# Patient Record
Sex: Female | Born: 1945 | Race: Black or African American | Hispanic: No | Marital: Married | Smoking: Former smoker
Health system: Southern US, Community
[De-identification: ages and names within clinical notes are randomized; demographics above are authoritative.]

## PROBLEM LIST (undated history)

## (undated) DIAGNOSIS — G20A1 Parkinson's disease without dyskinesia, without mention of fluctuations: Secondary | ICD-10-CM

## (undated) DIAGNOSIS — H409 Unspecified glaucoma: Secondary | ICD-10-CM

## (undated) DIAGNOSIS — G20 Parkinson's disease: Secondary | ICD-10-CM

## (undated) DIAGNOSIS — R32 Unspecified urinary incontinence: Secondary | ICD-10-CM

## (undated) DIAGNOSIS — E079 Disorder of thyroid, unspecified: Secondary | ICD-10-CM

## (undated) DIAGNOSIS — I456 Pre-excitation syndrome: Secondary | ICD-10-CM

## (undated) DIAGNOSIS — K219 Gastro-esophageal reflux disease without esophagitis: Secondary | ICD-10-CM

## (undated) DIAGNOSIS — E119 Type 2 diabetes mellitus without complications: Secondary | ICD-10-CM

## (undated) DIAGNOSIS — M1990 Unspecified osteoarthritis, unspecified site: Secondary | ICD-10-CM

## (undated) DIAGNOSIS — K635 Polyp of colon: Secondary | ICD-10-CM

## (undated) DIAGNOSIS — I10 Essential (primary) hypertension: Secondary | ICD-10-CM

## (undated) DIAGNOSIS — K900 Celiac disease: Secondary | ICD-10-CM

## (undated) DIAGNOSIS — G5600 Carpal tunnel syndrome, unspecified upper limb: Secondary | ICD-10-CM

## (undated) HISTORY — PX: VASCULAR SURGERY: SHX849

## (undated) HISTORY — PX: ABDOMINAL HYSTERECTOMY: SHX81

## (undated) HISTORY — PX: BREAST SURGERY: SHX581

## (undated) HISTORY — DX: Celiac disease: K90.0

---

## 2015-12-24 ENCOUNTER — Emergency Department (HOSPITAL_COMMUNITY)
Admission: EM | Admit: 2015-12-24 | Discharge: 2015-12-25 | Disposition: A | Discharge status: Home / Self Care | Payer: Medicare Other | Attending: Emergency Medicine | Admitting: Emergency Medicine

## 2015-12-24 ENCOUNTER — Encounter (HOSPITAL_COMMUNITY): Payer: Self-pay | Admitting: Oncology

## 2015-12-24 DIAGNOSIS — R600 Localized edema: Secondary | ICD-10-CM

## 2015-12-24 DIAGNOSIS — R609 Edema, unspecified: Secondary | ICD-10-CM | POA: Insufficient documentation

## 2015-12-24 DIAGNOSIS — Z7982 Long term (current) use of aspirin: Secondary | ICD-10-CM | POA: Insufficient documentation

## 2015-12-24 DIAGNOSIS — I10 Essential (primary) hypertension: Secondary | ICD-10-CM | POA: Insufficient documentation

## 2015-12-24 DIAGNOSIS — Z87891 Personal history of nicotine dependence: Secondary | ICD-10-CM | POA: Insufficient documentation

## 2015-12-24 HISTORY — DX: Essential (primary) hypertension: I10

## 2015-12-24 NOTE — ED Provider Notes (Signed)
CSN: [id number]     Arrival date & time 12/24/15  2126 History   First MD Initiated Contact with Patient 12/24/15 2236     Chief Complaint  Patient presents with  . Leg Swelling     (Consider location/radiation/quality/duration/timing/severity/associated sxs/prior Treatment) HPI Comments: Patient presents with leg swelling. She states over the last 1-2 years she's had swelling of both of her legs. The left leg is always a little bit more swollen than the right leg. She states over the last 2-3 months the swelling is been worse. It typically is better if she can keep her feet elevated but when she starts walking around the get more swollen. She denies any shortness of breath. She denies any chest pain. She denies any fevers. She's had some increased redness in her lower extremities with increased swelling. This redness has been there about a month. She previously was being seen in [place] where she is from. She is on medications or hypertension including Aldactone. She states she doesn't always take the Aldactone regularly because it makes her urinate more frequently.   Past Medical History  Diagnosis Date  . Hypertension    Past Surgical History  Procedure Laterality Date  . Breast surgery      lumpectomy  . Abdominal hysterectomy    . Vascular surgery     History reviewed. No pertinent family history. Social History  Substance Use Topics  . Smoking status: Former Games developer  . Smokeless tobacco: Never Used  . Alcohol Use: No   OB History    No data available     Review of Systems  Constitutional: Negative for fever, chills, diaphoresis and fatigue.  HENT: Negative for congestion, rhinorrhea and sneezing.   Eyes: Negative.   Respiratory: Negative for cough, chest tightness and shortness of breath.   Cardiovascular: Positive for leg swelling. Negative for chest pain.  Gastrointestinal: Negative for nausea, vomiting, abdominal pain, diarrhea and blood in stool.  Genitourinary:  Negative for frequency, hematuria, flank pain and difficulty urinating.  Musculoskeletal: Negative for back pain and arthralgias.  Skin: Negative for rash.  Neurological: Negative for dizziness, speech difficulty, weakness, numbness and headaches.      Allergies  Codeine and Wheat bran  Home Medications   Prior to Admission medications   Medication Sig Start Date End Date Taking? Authorizing Provider  amitriptyline (ELAVIL) 25 MG tablet Take 25 mg by mouth at bedtime as needed for sleep.   Yes Historical Provider, MD  aspirin EC 81 MG tablet Take 81 mg by mouth daily.   Yes Historical Provider, MD  B Complex-C (B-COMPLEX WITH VITAMIN C) tablet Take 1 tablet by mouth daily.   Yes Historical Provider, MD  BLACK CURRANT SEED OIL PO Take 2 capsules by mouth 2 (two) times daily.   Yes Historical Provider, MD  Coenzyme Q10 (CO Q 10 PO) Take 1 tablet by mouth daily.   Yes Historical Provider, MD  gabapentin (NEURONTIN) 300 MG capsule Take 300 mg by mouth at bedtime.   Yes Historical Provider, MD  latanoprost (XALATAN) 0.005 % ophthalmic solution Place 1 drop into both eyes at bedtime.   Yes Historical Provider, MD  levothyroxine (SYNTHROID, LEVOTHROID) 50 MCG tablet Take 50 mcg by mouth daily before breakfast.   Yes Historical Provider, MD  NIFEdipine (PROCARDIA) 20 MG capsule Take 20 mg by mouth daily.   Yes Historical Provider, MD  OVER THE COUNTER MEDICATION Take 4 mg by mouth 3 (three) times daily. piriton   Yes Historical Provider,  MD  OVER THE COUNTER MEDICATION Apply 1 application topically 3 (three) times daily as needed. Dry skin- E45 Cream   Yes Historical Provider, MD  simvastatin (ZOCOR) 40 MG tablet Take 40 mg by mouth daily.   Yes Historical Provider, MD  spironolactone (ALDACTONE) 50 MG tablet Take 50 mg by mouth daily.   Yes Historical Provider, MD  TURMERIC PO Take 5 mLs by mouth every other day.   Yes Historical Provider, MD  furosemide (LASIX) 20 MG tablet Take 1 tablet (20  mg total) by mouth daily. 12/25/15   Rolan BuccoMelanie Emiliano Welshans, MD   BP 149/100 mmHg  Pulse 70  Temp(Src) 98 F (36.7 C) (Oral)  Resp 16  SpO2 99% Physical Exam  Constitutional: She is oriented to person, place, and time. She appears well-developed and well-nourished.  HENT:  Head: Normocephalic and atraumatic.  Eyes: Pupils are equal, round, and reactive to light.  Neck: Normal range of motion. Neck supple.  Cardiovascular: Normal rate, regular rhythm and normal heart sounds.   Pulmonary/Chest: Effort normal and breath sounds normal. No respiratory distress. She has no wheezes. She has no rales. She exhibits no tenderness.  Abdominal: Soft. Bowel sounds are normal. There is no tenderness. There is no rebound and no guarding.  Musculoskeletal: Normal range of motion. She exhibits edema.  2+pitting edema bilaterally with the left renal little bit more swollen than the right. Pedal pulses are intact. She has mild erythema to the pretibial areas bilaterally which seems to be consistent with chronic edema. She has some scaling and chronic appearing skin changes consistent with edema.  Lymphadenopathy:    She has no cervical adenopathy.  Neurological: She is alert and oriented to person, place, and time.  Skin: Skin is warm and dry. No rash noted.  Psychiatric: She has a normal mood and affect.    ED Course  Procedures (including critical care time) Labs Review Labs Reviewed  D-DIMER, QUANTITATIVE (NOT AT [facility]) - Abnormal; Notable for the following:    D-Dimer, Quant 2.16 (*)    All other components within normal limits  COMPREHENSIVE METABOLIC PANEL - Abnormal; Notable for the following:    Glucose, Bld 104 (*)    All other components within normal limits  CBC WITH DIFFERENTIAL/PLATELET    Imaging Review No results found. I have personally reviewed and evaluated these images and lab results as part of my medical decision-making.   EKG Interpretation None     ED ECG REPORT   Date:  12/24/2015  Rate: 69  Rhythm: normal sinus rhythm  QRS Axis: normal  Intervals: normal  ST/T Wave abnormalities: nonspecific ST/T changes  Conduction Disutrbances:none  Narrative Interpretation:   Old EKG Reviewed: none available  I have personally reviewed the EKG tracing and agree with the computerized printout as noted.  MDM   Final diagnoses:  Pedal edema    Pt with worsening chronic lower extremity swelling.  Doubt cellulitis.  D-dimer elevated, will have pt return tomorrow for doppler studies.  Will give dose of xarelto for tonight.  Encouraged f/u with PCP.  Pt says her medicare will start July 1.  [facility].  Will start short term lasix, but encouraged her to take her aldactone regularly after that.  Return precautions given.    Rolan BuccoMelanie Gicela Schwarting, MD 12/25/15 [phone]

## 2015-12-24 NOTE — ED Notes (Signed)
Pt c/o b/l leg and feet swelling x several months.  Swelling to left leg/foot is worse than right.  Pt reports that she does keep legs elevated.  Presents tonight d/t not having insurance and wanting to make sure everything is okay.

## 2015-12-24 NOTE — ED Notes (Signed)
Pt reports having issues with allergic reactions to foods but unsure of what may be the primary cause. Pt denies any allergic reaction at this time but states that she has had most reactions with wheat products.

## 2015-12-25 LAB — COMPREHENSIVE METABOLIC PANEL
ALBUMIN: 4 g/dL (ref 3.5–5.0)
ALK PHOS: 51 U/L (ref 38–126)
ALT: 16 U/L (ref 14–54)
ANION GAP: 8 (ref 5–15)
AST: 23 U/L (ref 15–41)
BILIRUBIN TOTAL: 0.4 mg/dL (ref 0.3–1.2)
BUN: 13 mg/dL (ref 6–20)
CALCIUM: 9.4 mg/dL (ref 8.9–10.3)
CO2: 27 mmol/L (ref 22–32)
Chloride: 104 mmol/L (ref 101–111)
Creatinine, Ser: 0.81 mg/dL (ref 0.44–1.00)
GFR calc Af Amer: >60 mL/min (ref >60)
GFR calc non Af Amer: >60 mL/min (ref >60)
GLUCOSE: 104 mg/dL — AB (ref 65–99)
Potassium: 4.1 mmol/L (ref 3.5–5.1)
SODIUM: 139 mmol/L (ref 135–145)
Total Protein: 7.4 g/dL (ref 6.5–8.1)

## 2015-12-25 LAB — CBC WITH DIFFERENTIAL/PLATELET
Basophils Absolute: 0 10*3/uL (ref 0.0–0.1)
Basophils Relative: 0 %
EOS PCT: 2 %
Eosinophils Absolute: 0.1 10*3/uL (ref 0.0–0.7)
HEMATOCRIT: 38.3 % (ref 36.0–46.0)
Hemoglobin: 12.6 g/dL (ref 12.0–15.0)
LYMPHS ABS: 2.6 10*3/uL (ref 0.7–4.0)
LYMPHS PCT: 40 %
MCH: 29.4 pg (ref 26.0–34.0)
MCHC: 32.9 g/dL (ref 30.0–36.0)
MCV: 89.3 fL (ref 78.0–100.0)
Monocytes Absolute: 0.4 10*3/uL (ref 0.1–1.0)
Monocytes Relative: 6 %
NEUTROS ABS: 3.5 10*3/uL (ref 1.7–7.7)
Neutrophils Relative %: 52 %
PLATELETS: 267 10*3/uL (ref 150–400)
RBC: 4.29 MIL/uL (ref 3.87–5.11)
RDW: 12.6 % (ref 11.5–15.5)
WBC: 6.6 10*3/uL (ref 4.0–10.5)

## 2015-12-25 LAB — D-DIMER, QUANTITATIVE: D-Dimer, Quant: 2.16 ug/mL-FEU — ABNORMAL HIGH (ref 0.00–0.50)

## 2015-12-25 MED ORDER — RIVAROXABAN 15 MG PO TABS
15.0000 mg | ORAL_TABLET | Freq: Once | ORAL | Status: AC
  Administered 2015-12-25: 15 mg via ORAL
  Filled 2015-12-25: qty 1

## 2015-12-25 MED ORDER — FUROSEMIDE 20 MG PO TABS: 20.0000 mg | ORAL_TABLET | Freq: Every day | ORAL | Status: DC

## 2015-12-25 NOTE — Discharge Instructions (Signed)

## 2015-12-25 NOTE — ED Notes (Signed)
Pt reports understanding of discharge information. No questions at time of discharge 

## 2015-12-28 ENCOUNTER — Ambulatory Visit (HOSPITAL_COMMUNITY)
Admission: RE | Admit: 2015-12-28 | Discharge: 2015-12-28 | Disposition: A | Discharge status: Home / Self Care | Payer: Self-pay | Source: Ambulatory Visit | Attending: Emergency Medicine | Admitting: Emergency Medicine

## 2015-12-28 DIAGNOSIS — R791 Abnormal coagulation profile: Secondary | ICD-10-CM | POA: Insufficient documentation

## 2015-12-28 DIAGNOSIS — M7989 Other specified soft tissue disorders: Secondary | ICD-10-CM | POA: Insufficient documentation

## 2015-12-28 DIAGNOSIS — M79609 Pain in unspecified limb: Secondary | ICD-10-CM

## 2015-12-28 NOTE — Progress Notes (Signed)
Preliminary results by tech - Bilateral Lower Ext. Venous Duplex Completed. 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 Left leg, negative for deep and superficial vein thrombosis. Results given to Thomasene RippleMelissa, Charge Nurse [facility] prior to patient leaving. Patient was told to follow up with her PCP or [facility]. Marilynne Halstedita Kayliegh Boyers, BS, RDMS, RVT

## 2016-02-14 ENCOUNTER — Emergency Department (EMERGENCY_DEPARTMENT_HOSPITAL): Admit: 2016-02-14 | Discharge: 2016-02-14 | Disposition: A | Discharge status: Home / Self Care | Payer: Medicare Other

## 2016-02-14 ENCOUNTER — Encounter (HOSPITAL_COMMUNITY): Payer: Self-pay | Admitting: Emergency Medicine

## 2016-02-14 ENCOUNTER — Telehealth: Payer: Self-pay

## 2016-02-14 ENCOUNTER — Emergency Department (HOSPITAL_COMMUNITY)
Admission: EM | Admit: 2016-02-14 | Discharge: 2016-02-14 | Disposition: A | Discharge status: Home / Self Care | Payer: Medicare Other | Attending: Emergency Medicine | Admitting: Emergency Medicine

## 2016-02-14 DIAGNOSIS — Z87891 Personal history of nicotine dependence: Secondary | ICD-10-CM | POA: Diagnosis not present

## 2016-02-14 DIAGNOSIS — Z79899 Other long term (current) drug therapy: Secondary | ICD-10-CM | POA: Diagnosis not present

## 2016-02-14 DIAGNOSIS — I825Y1 Chronic embolism and thrombosis of unspecified deep veins of right proximal lower extremity: Secondary | ICD-10-CM

## 2016-02-14 DIAGNOSIS — I10 Essential (primary) hypertension: Secondary | ICD-10-CM | POA: Diagnosis not present

## 2016-02-14 DIAGNOSIS — I83029 Varicose veins of left lower extremity with ulcer of unspecified site: Secondary | ICD-10-CM

## 2016-02-14 DIAGNOSIS — Z7982 Long term (current) use of aspirin: Secondary | ICD-10-CM | POA: Diagnosis not present

## 2016-02-14 DIAGNOSIS — L97919 Non-pressure chronic ulcer of unspecified part of right lower leg with unspecified severity: Secondary | ICD-10-CM | POA: Diagnosis present

## 2016-02-14 DIAGNOSIS — L97929 Non-pressure chronic ulcer of unspecified part of left lower leg with unspecified severity: Secondary | ICD-10-CM | POA: Insufficient documentation

## 2016-02-14 DIAGNOSIS — R609 Edema, unspecified: Secondary | ICD-10-CM

## 2016-02-14 DIAGNOSIS — M79669 Pain in unspecified lower leg: Secondary | ICD-10-CM

## 2016-02-14 DIAGNOSIS — E119 Type 2 diabetes mellitus without complications: Secondary | ICD-10-CM | POA: Insufficient documentation

## 2016-02-14 DIAGNOSIS — I82501 Chronic embolism and thrombosis of unspecified deep veins of right lower extremity: Secondary | ICD-10-CM | POA: Insufficient documentation

## 2016-02-14 DIAGNOSIS — I83019 Varicose veins of right lower extremity with ulcer of unspecified site: Secondary | ICD-10-CM

## 2016-02-14 HISTORY — DX: Type 2 diabetes mellitus without complications: E11.9

## 2016-02-14 HISTORY — DX: Unspecified urinary incontinence: R32

## 2016-02-14 HISTORY — DX: Parkinson's disease without dyskinesia, without mention of fluctuations: G20.A1

## 2016-02-14 HISTORY — DX: Unspecified glaucoma: H40.9

## 2016-02-14 HISTORY — DX: Carpal tunnel syndrome, unspecified upper limb: G56.00

## 2016-02-14 HISTORY — DX: Disorder of thyroid, unspecified: E07.9

## 2016-02-14 HISTORY — DX: Pre-excitation syndrome: I45.6

## 2016-02-14 HISTORY — DX: Unspecified osteoarthritis, unspecified site: M19.90

## 2016-02-14 HISTORY — DX: Gastro-esophageal reflux disease without esophagitis: K21.9

## 2016-02-14 HISTORY — DX: Parkinson's disease: G20

## 2016-02-14 HISTORY — DX: Polyp of colon: K63.5

## 2016-02-14 MED ORDER — EPINEPHRINE 0.3 MG/0.3ML IJ SOAJ: 0.3000 mg | Freq: Once | INTRAMUSCULAR | Status: AC

## 2016-02-14 NOTE — ED Notes (Signed)
Patient coming from home with c/o of bilateral leg pain with poor circulation.

## 2016-02-14 NOTE — Discharge Instructions (Signed)
Take extra strength Tylenol as needed for pain. You may go to the walk-in clinic at [facility] on [address] if you have any concerns. Keep your appointment for August 11 to be seen that your primary care provider.  Return to the emergency department if you experience shortness of breath, chest pain, dizziness, you pass out, fevers, chills, increased redness from discharge your legs.  Deep Vein Thrombosis A deep vein thrombosis (DVT) is a blood clot (thrombus) that usually occurs in a deep, larger vein of the lower leg or the pelvis, or in an upper extremity such as the arm. These are dangerous and can lead to serious and even life-threatening complications if the clot travels to the lungs. A DVT can damage the valves in your leg veins so that instead of flowing upward, the blood pools in the lower leg. This is called post-thrombotic syndrome, and it can result in pain, swelling, discoloration, and sores on the leg. CAUSES A DVT is caused by the formation of a blood clot in your leg, pelvis, or arm. Usually, several things contribute to the formation of blood clots. A clot may develop when:  Your blood flow slows down.  Your vein becomes damaged in some way.  You have a condition that makes your blood clot more easily. RISK FACTORS A DVT is more likely to develop in:  People who are older, especially over 69 years of age.  People who are overweight (obese).  People who sit or lie still for a long time, such as during long-distance travel (over 4 hours), bed rest, hospitalization, or during recovery from certain medical conditions like a stroke.  People who do not engage in much physical activity (sedentary lifestyle).  People who have chronic breathing disorders.  People who have a personal or family history of blood clots or blood clotting disease.  People who have peripheral vascular disease (PVD), diabetes, or some types of cancer.  People who have heart disease, especially  if the person had a recent heart attack or has congestive heart failure.  People who have neurological diseases that affect the legs (leg paresis).  People who have had a traumatic injury, such as breaking a hip or leg.  People who have recently had major or lengthy surgery, especially on the hip, knee, or abdomen.  People who have had a central line placed inside a large vein.  People who take medicines that contain the hormone estrogen. These include birth control pills and hormone replacement therapy.  Pregnancy or during childbirth or the postpartum period.  Long plane flights (over 8 hours). SIGNS AND SYMPTOMS Symptoms of a DVT can include:   Swelling of your leg or arm, especially if one side is much worse.  Warmth and redness of your leg or arm, especially if one side is much worse.  Pain in your arm or leg. If the clot is in your leg, symptoms may be more noticeable or worse when you stand or walk.  A feeling of pins and needles, if the clot is in the arm. The symptoms of a DVT that has traveled to the lungs (pulmonary embolism, PE) usually start suddenly and include:  Shortness of breath while active or at rest.  Coughing or coughing up blood or blood-tinged mucus.  Chest pain that is often worse with deep breaths.  Rapid or irregular heartbeat.  Feeling light-headed or dizzy.  Fainting.  Feeling anxious.  Sweating. There may also be pain and swelling in a leg if that  is where the blood clot started. These symptoms may represent a serious problem that is an emergency. Do not wait to see if the symptoms will go away. Get medical help right away. Call your local emergency services (911 in the [place] Do not drive yourself to the hospital. DIAGNOSIS Your health care provider will take a medical history and perform a physical exam. You may also have other tests, including:  Blood tests to assess the clotting properties of your blood.  Imaging tests, such as CT,  ultrasound, MRI, X-ray, and other tests to see if you have clots anywhere in your body. TREATMENT After a DVT is identified, it can be treated. The type of treatment that you receive depends on many factors, such as the cause of your DVT, your risk for bleeding or developing more clots, and other medical conditions that you have. Sometimes, a combination of treatments is necessary. Treatment options may be combined and include:  Monitoring the blood clot with ultrasound.  Taking medicines by mouth, such as newer blood thinners (anticoagulants), thrombolytics, or warfarin.  Taking anticoagulant medicine by injection or through an IV tube.  Wearing compression stockings or using different types ofdevices.  Surgery (rare) to remove the blood clot or to place a filter in your abdomen to stop the blood clot from traveling to your lungs. Treatments for a DVT are often divided into immediate treatment and long-term treatment (up to 3 months after DVT). You can work with your health care provider to choose the treatment program that is best for you. HOME CARE INSTRUCTIONS If you are taking a newer oral anticoagulant:  Take the medicine every single day at the same time each day.  Understand what foods and drugs interact with this medicine.  Understand that there are no regular blood tests required when using this medicine.  Understand the side effects of this medicine, including excessive bruising or bleeding. Ask your health care provider or pharmacist about other possible side effects. If you are taking warfarin:  Understand how to take warfarin and know which foods can affect how warfarin works in Public relations account executive.  Understand that it is dangerous to take too much or too little warfarin. Too much warfarin increases the risk of bleeding. Too little warfarin continues to allow the risk for blood clots.  Follow your PT and INR blood testing schedule. The PT and INR results allow your health care  provider to adjust your dose of warfarin. It is very important that you have your PT and INR tested as often as told by your health care provider.  Avoid major changes in your diet, or tell your health care provider before you change your diet. Arrange a visit with a registered dietitian to answer your questions. Many foods, especially foods that are high in vitamin K, can interfere with warfarin and affect the PT and INR results. Eat a consistent amount of foods that are high in vitamin K, such as:  Spinach, kale, broccoli, cabbage, collard greens, turnip greens, Brussels sprouts, peas, cauliflower, seaweed, and parsley.  Beef liver and pork liver.  Green tea.  Soybean oil.  Tell your health care provider about any and all medicines, vitamins, and supplements that you take, including aspirin and other over-the-counter anti-inflammatory medicines. Be especially cautious with aspirin and anti-inflammatory medicines. Do not take those before you ask your health care provider if it is safe to do so. This is important because many medicines can interfere with warfarin and affect the PT and INR  results.  Do not start or stop taking any over-the-counter or prescription medicine unless your health care provider or pharmacist tells you to do so. If you take warfarin, you will also need to do these things:  Hold pressure over cuts for longer than usual.  Tell your dentist and other health care providers that you are taking warfarin before you have any procedures in which bleeding may occur.  Avoid alcohol or drink very small amounts. Tell your health care provider if you change your alcohol intake.  Do not use tobacco products, including cigarettes, chewing tobacco, and e-cigarettes. If you need help quitting, ask your health care provider.  Avoid contact sports. General Instructions  Take over-the-counter and prescription medicines only as told by your health care provider. Anticoagulant  medicines can have side effects, including easy bruising and difficulty stopping bleeding. If you are prescribed an anticoagulant, you will also need to do these things:  Hold pressure over cuts for longer than usual.  Tell your dentist and other health care providers that you are taking anticoagulants before you have any procedures in which bleeding may occur.  Avoid contact sports.  Wear a medical alert bracelet or carry a medical alert card that says you have had a PE.  Ask your health care provider how soon you can go back to your normal activities. Stay active to prevent new blood clots from forming.  Make sure to exercise while traveling or when you have been sitting or standing for a long period of time. It is very important to exercise. Exercise your legs by walking or by tightening and relaxing your leg muscles often. Take frequent walks.  Wear compression stockings as told by your health care provider to help prevent more blood clots from forming.  Do not use tobacco products, including cigarettes, chewing tobacco, and e-cigarettes. If you need help quitting, ask your health care provider.  Keep all follow-up appointments with your health care provider. This is important. PREVENTION Take these actions to decrease your risk of developing another DVT:  Exercise regularly. For at least 30 minutes every day, engage in:  Activity that involves moving your arms and legs.  Activity that encourages good blood flow through your body by increasing your heart rate.  Exercise your arms and legs every hour during long-distance travel (over 4 hours). Drink plenty of water and avoid drinking alcohol while traveling.  Avoid sitting or lying in bed for long periods of time without moving your legs.  Maintain a weight that is appropriate for your height. Ask your health care provider what weight is healthy for you.  If you are a woman who is over 93 years of age, avoid unnecessary use of  medicines that contain estrogen. These include birth control pills.  Do not smoke, especially if you take estrogen medicines. If you need help quitting, ask your health care provider. If you are hospitalized, prevention measures may include:  Early walking after surgery, as soon as your health care provider says that it is safe.  Receiving anticoagulants to prevent blood clots.If you cannot take anticoagulants, other options may be available, such as wearing compression stockings or using different types of devices. SEEK IMMEDIATE MEDICAL CARE IF:  You have new or increased pain, swelling, or redness in an arm or leg.  You have numbness or tingling in an arm or leg.  You have shortness of breath while active or at rest.  You have chest pain.  You have a rapid or irregular  heartbeat.  You feel light-headed or dizzy.  You cough up blood.  You notice blood in your vomit, bowel movement, or urine. These symptoms may represent a serious problem that is an emergency. Do not wait to see if the symptoms will go away. Get medical help right away. Call your local emergency services (911 in the [place] Do not drive yourself to the hospital.   This information is not intended to replace advice given to you by your health care provider. Make sure you discuss any questions you have with your health care provider.   Document Released: 07/17/2005 Document Revised: 04/07/2015 Document Reviewed: 11/11/2014 Elsevier Interactive Patient Education 2016 [employer]  Venous Stasis or Chronic Venous Insufficiency Chronic venous insufficiency, also called venous stasis, is a condition that affects the veins in the legs. The condition prevents blood from being pumped through these veins effectively. Blood may no longer be pumped effectively from the legs back to the heart. This condition can range from mild to severe. With proper treatment, you should be able to continue with an active life. CAUSES    Chronic venous insufficiency occurs when the vein walls become stretched, weakened, or damaged or when valves within the vein are damaged. Some common causes of this include:  High blood pressure inside the veins (venous hypertension).  Increased blood pressure in the leg veins from long periods of sitting or standing.  A blood clot that blocks blood flow in a vein (deep vein thrombosis).  Inflammation of a superficial vein (phlebitis) that causes a blood clot to form. RISK FACTORS Various things can make you more likely to develop chronic venous insufficiency, including:  Family history of this condition.  Obesity.  Pregnancy.  Sedentary lifestyle.  Smoking.  Jobs requiring long periods of standing or sitting in one place.  Being a certain age. Women in their 24s and 42s and men in their 61s are more likely to develop this condition. SIGNS AND SYMPTOMS  Symptoms may include:   Varicose veins.  Skin breakdown or ulcers.  Reddened or discolored skin on the leg.  Brown, smooth, tight, and painful skin just above the ankle, usually on the inside surface (lipodermatosclerosis).  Swelling. DIAGNOSIS  To diagnose this condition, your health care provider will take a medical history and do a physical exam. The following tests may be ordered to confirm the diagnosis:  Duplex ultrasound--A procedure that produces a picture of a blood vessel and nearby organs and also provides information on blood flow through the blood vessel.  Plethysmography--A procedure that tests blood flow.  A venogram, or venography--A procedure used to look at the veins using X-ray and dye. TREATMENT The goals of treatment are to help you return to an active life and to minimize pain or disability. Treatment will depend on the severity of the condition. Medical procedures may be needed for severe cases. Treatment options may include:   Use of compression stockings. These can help with symptoms and  lower the chances of the problem getting worse, but they do not cure the problem.  Sclerotherapy--A procedure involving an injection of a material that "dissolves" the damaged veins. Other veins in the network of blood vessels take over the function of the damaged veins.  Surgery to remove the vein or cut off blood flow through the vein (vein stripping or laser ablation surgery).  Surgery to repair a valve. HOME CARE INSTRUCTIONS   Wear compression stockings as directed by your health care provider.  Only take over-the-counter or  prescription medicines for pain, discomfort, or fever as directed by your health care provider.  Follow up with your health care provider as directed. SEEK MEDICAL CARE IF:   You have redness, swelling, or increasing pain in the affected area.  You see a red streak or line that extends up or down from the affected area.  You have a breakdown or loss of skin in the affected area, even if the breakdown is small.  You have an injury to the affected area. SEEK IMMEDIATE MEDICAL CARE IF:   You have an injury and open wound in the affected area.  Your pain is severe and does not improve with medicine.  You have sudden numbness or weakness in the foot or ankle below the affected area, or you have trouble moving your foot or ankle.  You have a fever or persistent symptoms for more than 2-3 days.  You have a fever and your symptoms suddenly get worse. MAKE SURE YOU:   Understand these instructions.  Will watch your condition.  Will get help right away if you are not doing well or get worse.   This information is not intended to replace advice given to you by your health care provider. Make sure you discuss any questions you have with your health care provider.   Document Released: 11/20/2006 Document Revised: 05/07/2013 Document Reviewed: 03/24/2013 Elsevier Interactive Patient Education [employer]

## 2016-02-14 NOTE — ED Provider Notes (Addendum)
Complains of bilateral lower leg pain, below the knees onset beginning of June 2017. Pain is worse with weightbearing and she develops leg edema with weightbearing. On exam patient has brawny skin changes below the knees bilaterally with multiple superficial ulcers on she calves. DP pulses 2+ bilaterally. Medical decision making in light of fact that the DVT is now chronic and unchanged over 1.5 months will not anticoagulate this patient. Doug SouSam Isabella Roemmich, MD 02/14/16 1402   Doug SouSam Frederik Standley, MD 02/14/16 1610

## 2016-02-14 NOTE — Progress Notes (Signed)
Preliminary results by tech - Venous Duplex Lower Ext. Completed. Right leg, no evidence of acute deep vein thrombosis. There is non-obstructing deep vein thrombosis in the right peroneal veins, which was noted on 12/28/15. All other veins are patent without thrombosis. Left leg, no evidence of deep or superficial vein thrombosis. Marilynne Halstedita Jourdin Gens, BS, RDMS, RVT

## 2016-02-14 NOTE — Care Management (Signed)
Needed PCP  Has PCP listed on back of insurance card. States cannot get in with them till OCT.  Was trying to find someone to accept her earlier.  Called and got a hospital followup appointment with plans for a referral to vascular specialist for Aug 11 at 12noon.  Gave appointment to patient, confirmed sons number was correct.  Also placed on DC summary.

## 2016-02-14 NOTE — Telephone Encounter (Signed)
Call received from Avie ArenasSarah Brown, RN CM requesting [facility] as the patient needs to establish care with a PCP in order to obtain a referral to a vascular specialist. [facility]. The # for the sickle cell clinic was provided # [phone] as the clinic also provides primary care follow up . It was then noted in EPIC  that the [facility] and has a PCP assigned - Tally Joeavid Swayne, MD # [phone].
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
## 2016-03-14 ENCOUNTER — Other Ambulatory Visit: Payer: Self-pay | Admitting: Vascular Surgery

## 2016-03-14 DIAGNOSIS — L97909 Non-pressure chronic ulcer of unspecified part of unspecified lower leg with unspecified severity: Principal | ICD-10-CM

## 2016-03-14 DIAGNOSIS — I83009 Varicose veins of unspecified lower extremity with ulcer of unspecified site: Secondary | ICD-10-CM

## 2016-03-15 ENCOUNTER — Encounter (HOSPITAL_COMMUNITY): Payer: Self-pay | Admitting: Emergency Medicine

## 2016-03-15 ENCOUNTER — Emergency Department (HOSPITAL_COMMUNITY)
Admission: EM | Admit: 2016-03-15 | Discharge: 2016-03-15 | Disposition: A | Discharge status: Home / Self Care | Payer: Medicare Other | Attending: Emergency Medicine | Admitting: Emergency Medicine

## 2016-03-15 DIAGNOSIS — E039 Hypothyroidism, unspecified: Secondary | ICD-10-CM | POA: Insufficient documentation

## 2016-03-15 DIAGNOSIS — I82502 Chronic embolism and thrombosis of unspecified deep veins of left lower extremity: Secondary | ICD-10-CM | POA: Insufficient documentation

## 2016-03-15 DIAGNOSIS — I872 Venous insufficiency (chronic) (peripheral): Secondary | ICD-10-CM | POA: Insufficient documentation

## 2016-03-15 DIAGNOSIS — Z79899 Other long term (current) drug therapy: Secondary | ICD-10-CM | POA: Insufficient documentation

## 2016-03-15 DIAGNOSIS — G20 Parkinson's disease: Secondary | ICD-10-CM | POA: Insufficient documentation

## 2016-03-15 DIAGNOSIS — Z87891 Personal history of nicotine dependence: Secondary | ICD-10-CM | POA: Diagnosis not present

## 2016-03-15 DIAGNOSIS — I10 Essential (primary) hypertension: Secondary | ICD-10-CM | POA: Diagnosis not present

## 2016-03-15 DIAGNOSIS — L03116 Cellulitis of left lower limb: Secondary | ICD-10-CM | POA: Diagnosis not present

## 2016-03-15 DIAGNOSIS — M79672 Pain in left foot: Secondary | ICD-10-CM | POA: Diagnosis present

## 2016-03-15 DIAGNOSIS — Z7982 Long term (current) use of aspirin: Secondary | ICD-10-CM | POA: Diagnosis not present

## 2016-03-15 DIAGNOSIS — I825Y2 Chronic embolism and thrombosis of unspecified deep veins of left proximal lower extremity: Secondary | ICD-10-CM

## 2016-03-15 DIAGNOSIS — E119 Type 2 diabetes mellitus without complications: Secondary | ICD-10-CM | POA: Diagnosis not present

## 2016-03-15 LAB — COMPREHENSIVE METABOLIC PANEL
ALBUMIN: 4.1 g/dL (ref 3.5–5.0)
ALT: 29 U/L (ref 14–54)
ANION GAP: 9 (ref 5–15)
AST: 43 U/L — ABNORMAL HIGH (ref 15–41)
Alkaline Phosphatase: 56 U/L (ref 38–126)
BUN: 16 mg/dL (ref 6–20)
CALCIUM: 9.2 mg/dL (ref 8.9–10.3)
CHLORIDE: 101 mmol/L (ref 101–111)
CO2: 27 mmol/L (ref 22–32)
CREATININE: 0.92 mg/dL (ref 0.44–1.00)
GFR calc non Af Amer: >60 mL/min (ref >60)
Glucose, Bld: 108 mg/dL — ABNORMAL HIGH (ref 65–99)
Potassium: 4.5 mmol/L (ref 3.5–5.1)
SODIUM: 137 mmol/L (ref 135–145)
TOTAL PROTEIN: 8 g/dL (ref 6.5–8.1)
Total Bilirubin: 0.5 mg/dL (ref 0.3–1.2)

## 2016-03-15 LAB — CBC WITH DIFFERENTIAL/PLATELET
BASOS PCT: 0 %
Basophils Absolute: 0 10*3/uL (ref 0.0–0.1)
EOS ABS: 0.3 10*3/uL (ref 0.0–0.7)
EOS PCT: 5 %
HCT: 38.9 % (ref 36.0–46.0)
Hemoglobin: 12.3 g/dL (ref 12.0–15.0)
LYMPHS ABS: 2.6 10*3/uL (ref 0.7–4.0)
Lymphocytes Relative: 39 %
MCH: 29 pg (ref 26.0–34.0)
MCHC: 31.6 g/dL (ref 30.0–36.0)
MCV: 91.7 fL (ref 78.0–100.0)
Monocytes Absolute: 0.6 10*3/uL (ref 0.1–1.0)
Monocytes Relative: 9 %
Neutro Abs: 3.2 10*3/uL (ref 1.7–7.7)
Neutrophils Relative %: 47 %
PLATELETS: 281 10*3/uL (ref 150–400)
RBC: 4.24 MIL/uL (ref 3.87–5.11)
RDW: 12.9 % (ref 11.5–15.5)
WBC: 6.7 10*3/uL (ref 4.0–10.5)

## 2016-03-15 LAB — I-STAT CG4 LACTIC ACID, ED: LACTIC ACID, VENOUS: 1.15 mmol/L (ref 0.5–1.9)

## 2016-03-15 MED ORDER — KETOROLAC TROMETHAMINE 30 MG/ML IJ SOLN
15.0000 mg | Freq: Once | INTRAMUSCULAR | Status: AC
  Administered 2016-03-15: 15 mg via INTRAVENOUS
  Filled 2016-03-15: qty 1

## 2016-03-15 MED ORDER — CEPHALEXIN 500 MG PO CAPS: 500.0000 mg | ORAL_CAPSULE | Freq: Two times a day (BID) | ORAL | 0 refills | Status: DC

## 2016-03-15 NOTE — ED Triage Notes (Signed)
Pt is c/o pain in her feet esp the left one  Pt states she has poor circulation  The left leg is scaly, dark in color, and draining  Pt states she is supposed to be finding a primary dr and a vascular dr  Pt states she may get to see the vascular dr next Thursday but it is not definate

## 2016-03-15 NOTE — ED Provider Notes (Signed)
I discussed the patient's presentation with Dr. Darrick Pennafields, our vascular surgery colleague. No recommendation for current anticoagulation. Patient is scheduled for follow-up in one month, will be encouraged to expedite her visit.    Gerhard Munchobert Crystalee Ventress, MD 03/15/16 1001

## 2016-03-15 NOTE — ED Notes (Signed)
PT DISCHARGED. INSTRUCTIONS AND PRESCRIPTION GIVEN. AAOX4. PT IN NO APPARENT DISTRESS. THE OPPORTUNITY TO ASK QUESTIONS WAS PROVIDED. 

## 2016-03-15 NOTE — ED Provider Notes (Signed)
[facility]-EMERGENCY DEPT Provider Note   CSN: [id number] Arrival date & time: 03/15/16  0151     History   Chief Complaint Chief Complaint  Patient presents with  . Foot Pain    HPI Vickie Kim is a 70 y.o. female.  The history is provided by the patient.  Foot Pain   She complains that her feet are cold and painful for the last several months. Pain got significantly worse tonight. She rates pain at 8-9/10. Nothing makes them better nothing makes them worse. She is scheduled to see a vascular surgeon, but appointment is not until next month. She has been taking acetaminophen for pain without relief.  Past Medical History:  Diagnosis Date  . Acid reflux   . Arthritis   . Carpal tunnel syndrome   . Diabetes mellitus without complication (HCC)   . Early-onset Parkinson's disease (HCC)   . Glaucoma   . Hypertension   . Polyp of colon   . Thyroid disease    Hypothyroid  . Urine incontinence   . Evelene CroonWolff Parkinson White pattern seen on electrocardiogram     There are no active problems to display for this patient.   Past Surgical History:  Procedure Laterality Date  . ABDOMINAL HYSTERECTOMY    . BREAST SURGERY     lumpectomy  . VASCULAR SURGERY      OB History    No data available       Home Medications    Prior to Admission medications   Medication Sig Start Date End Date Taking? Authorizing Provider  amitriptyline (ELAVIL) 25 MG tablet Take 25 mg by mouth at bedtime as needed for sleep.   Yes Historical Provider, MD  aspirin EC 81 MG tablet Take 81 mg by mouth daily.   Yes Historical Provider, MD  B Complex-C (B-COMPLEX WITH VITAMIN C) tablet Take 1 tablet by mouth daily.   Yes Historical Provider, MD  BLACK CURRANT SEED OIL PO Take 2 capsules by mouth 2 (two) times daily.   Yes Historical Provider, MD  Coenzyme Q10 (CO Q 10 PO) Take 1 tablet by mouth daily.   Yes Historical Provider, MD  diphenhydrAMINE (BENADRYL) 25 mg capsule Take 25 mg by mouth  every 6 (six) hours as needed for itching.   Yes Historical Provider, MD  EPINEPHrine 0.3 mg/0.3 mL IJ SOAJ injection Inject 0.3 mLs (0.3 mg total) into the muscle once. 02/14/16  Yes Jerre SimonJessica L Focht, PA  furosemide (LASIX) 20 MG tablet Take 1 tablet (20 mg total) by mouth daily. 12/25/15  Yes Rolan BuccoMelanie Belfi, MD  gabapentin (NEURONTIN) 300 MG capsule Take 300 mg by mouth at bedtime.   Yes Historical Provider, MD  latanoprost (XALATAN) 0.005 % ophthalmic solution Place 1 drop into both eyes at bedtime.   Yes Historical Provider, MD  levothyroxine (SYNTHROID, LEVOTHROID) 50 MCG tablet Take 50 mcg by mouth daily before breakfast.   Yes Historical Provider, MD  NIFEdipine (PROCARDIA) 20 MG capsule Take 20 mg by mouth daily.   Yes Historical Provider, MD  OVER THE COUNTER MEDICATION Take 4 mg by mouth 3 (three) times daily. piriton   Yes Historical Provider, MD  OVER THE COUNTER MEDICATION Apply 1 application topically 3 (three) times daily as needed. Dry skin- E45 Cream   Yes Historical Provider, MD  simvastatin (ZOCOR) 40 MG tablet Take 40 mg by mouth daily.   Yes Historical Provider, MD  spironolactone (ALDACTONE) 50 MG tablet Take 50 mg by mouth daily.   Yes Historical  Provider, MD  TURMERIC PO Take 5 mLs by mouth every other day.   Yes Historical Provider, MD    Family History History reviewed. No pertinent family history.  Social History Social History  Substance Use Topics  . Smoking status: Former Games developermoker  . Smokeless tobacco: Never Used  . Alcohol use No     Allergies   Gluten meal; Codeine; and Wheat bran   Review of Systems Review of Systems  All other systems reviewed and are negative.    Physical Exam Updated Vital Signs BP 121/67 (BP Location: Right Arm)   Pulse 75   Resp 16   SpO2 100%   Physical Exam  Nursing note and vitals reviewed.  70 year old female, resting comfortably and in no acute distress. Vital signs are normal. Oxygen saturation is 100%, which is  normal. Head is normocephalic and atraumatic. PERRLA, EOMI. Oropharynx is clear. Neck is nontender and supple without adenopathy or JVD. Back is nontender and there is no CVA tenderness. Lungs are clear without rales, wheezes, or rhonchi. Chest is nontender. Heart has regular rate and rhythm without murmur. Abdomen is soft, flat, nontender without masses or hepatosplenomegaly and peristalsis is normoactive. Extremities: Severe venous stasis changes are present bilaterally, worse on the left. The right leg is cold from just distal to the knee, but dorsalis pedis pulses palpable and capillary refill is prompt. Left leg is warm to the touch. Skin is thickened with cracking and some areas with mild purulent drainage. Skin is warm and dry without other rash. Neurologic: Mental status is normal, cranial nerves are intact, there are no motor or sensory deficits.  ED Treatments / Results  Labs (all labs ordered are listed, but only abnormal results are displayed) Labs Reviewed  COMPREHENSIVE METABOLIC PANEL - Abnormal; Notable for the following:       Result Value   Glucose, Bld 108 (*)    AST 43 (*)    All other components within normal limits  CBC WITH DIFFERENTIAL/PLATELET  I-STAT CG4 LACTIC ACID, ED     Procedures Procedures (including critical care time)  Medications Ordered in ED Medications  ketorolac (TORADOL) 30 MG/ML injection 15 mg (15 mg Intravenous Given 03/15/16 0612)     Initial Impression / Assessment and Plan / ED Course  I have reviewed the triage vital signs and the nursing notes.  Pertinent labs & imaging results that were available during my care of the patient were reviewed by me and considered in my medical decision making (see chart for details).  Clinical Course    Severe venous stasis with probable early cellulitis of the left leg. Old records are reviewed, and she has been seen for the same complaints place in the last several months and has had 2 venous  vascular studies showing chronic DVT of the right leg. She was not put on anticoagulation.  Screening labs are unremarkable. Lactic acid level is normal and the UVC is normal. I am concerned about whether the patient should be on anticoagulation given chronic DVT. Consult request has been placed to vascular surgery but no call back yet. Case is signed out to Dr. Jeraldine LootsLockwood.  Final Clinical Impressions(s) / ED Diagnoses   Final diagnoses:  Cellulitis of left lower leg  Venous insufficiency of both lower extremities  Chronic deep vein thrombosis (DVT) of proximal vein of left lower extremity [facility](HCC)    New Prescriptions New Prescriptions   No medications on file     Dione Boozeavid Ski Polich, MD 03/15/16  0824  

## 2016-03-28 DIAGNOSIS — L97909 Non-pressure chronic ulcer of unspecified part of unspecified lower leg with unspecified severity: Secondary | ICD-10-CM

## 2016-03-28 DIAGNOSIS — I83009 Varicose veins of unspecified lower extremity with ulcer of unspecified site: Secondary | ICD-10-CM | POA: Insufficient documentation

## 2016-03-28 DIAGNOSIS — M79604 Pain in right leg: Secondary | ICD-10-CM | POA: Insufficient documentation

## 2016-03-28 DIAGNOSIS — M79605 Pain in left leg: Secondary | ICD-10-CM

## 2016-03-28 DIAGNOSIS — I872 Venous insufficiency (chronic) (peripheral): Secondary | ICD-10-CM | POA: Insufficient documentation

## 2016-04-13 ENCOUNTER — Encounter: Payer: Medicare Other | Admitting: Vascular Surgery

## 2016-04-13 ENCOUNTER — Encounter (HOSPITAL_COMMUNITY): Payer: Medicare Other

## 2016-05-21 ENCOUNTER — Encounter (HOSPITAL_COMMUNITY): Payer: Self-pay

## 2016-05-21 ENCOUNTER — Emergency Department (HOSPITAL_COMMUNITY)
Admission: EM | Admit: 2016-05-21 | Discharge: 2016-05-21 | Disposition: A | Discharge status: Home / Self Care | Payer: Medicare Other | Attending: Emergency Medicine | Admitting: Emergency Medicine

## 2016-05-21 ENCOUNTER — Emergency Department (HOSPITAL_COMMUNITY): Payer: Medicare Other

## 2016-05-21 DIAGNOSIS — K5900 Constipation, unspecified: Secondary | ICD-10-CM | POA: Diagnosis not present

## 2016-05-21 DIAGNOSIS — G20 Parkinson's disease: Secondary | ICD-10-CM | POA: Diagnosis not present

## 2016-05-21 DIAGNOSIS — E119 Type 2 diabetes mellitus without complications: Secondary | ICD-10-CM | POA: Diagnosis not present

## 2016-05-21 DIAGNOSIS — Y9289 Other specified places as the place of occurrence of the external cause: Secondary | ICD-10-CM | POA: Insufficient documentation

## 2016-05-21 DIAGNOSIS — W1809XA Striking against other object with subsequent fall, initial encounter: Secondary | ICD-10-CM | POA: Diagnosis not present

## 2016-05-21 DIAGNOSIS — I10 Essential (primary) hypertension: Secondary | ICD-10-CM | POA: Diagnosis not present

## 2016-05-21 DIAGNOSIS — R52 Pain, unspecified: Secondary | ICD-10-CM

## 2016-05-21 DIAGNOSIS — Z87891 Personal history of nicotine dependence: Secondary | ICD-10-CM | POA: Diagnosis not present

## 2016-05-21 DIAGNOSIS — F0781 Postconcussional syndrome: Secondary | ICD-10-CM | POA: Diagnosis not present

## 2016-05-21 DIAGNOSIS — Z79899 Other long term (current) drug therapy: Secondary | ICD-10-CM | POA: Diagnosis not present

## 2016-05-21 DIAGNOSIS — E039 Hypothyroidism, unspecified: Secondary | ICD-10-CM | POA: Insufficient documentation

## 2016-05-21 DIAGNOSIS — S0990XA Unspecified injury of head, initial encounter: Secondary | ICD-10-CM | POA: Diagnosis present

## 2016-05-21 DIAGNOSIS — M62838 Other muscle spasm: Secondary | ICD-10-CM | POA: Diagnosis not present

## 2016-05-21 DIAGNOSIS — Y999 Unspecified external cause status: Secondary | ICD-10-CM | POA: Diagnosis not present

## 2016-05-21 DIAGNOSIS — Y9389 Activity, other specified: Secondary | ICD-10-CM | POA: Insufficient documentation

## 2016-05-21 DIAGNOSIS — Z7982 Long term (current) use of aspirin: Secondary | ICD-10-CM | POA: Insufficient documentation

## 2016-05-21 DIAGNOSIS — H9313 Tinnitus, bilateral: Secondary | ICD-10-CM

## 2016-05-21 LAB — I-STAT CHEM 8, ED
BUN: 14 mg/dL (ref 6–20)
CALCIUM ION: 1.12 mmol/L — AB (ref 1.15–1.40)
CHLORIDE: 103 mmol/L (ref 101–111)
Creatinine, Ser: 0.8 mg/dL (ref 0.44–1.00)
GLUCOSE: 115 mg/dL — AB (ref 65–99)
HCT: 43 % (ref 36.0–46.0)
Hemoglobin: 14.6 g/dL (ref 12.0–15.0)
Potassium: 5 mmol/L (ref 3.5–5.1)
Sodium: 140 mmol/L (ref 135–145)
TCO2: 29 mmol/L (ref 0–100)

## 2016-05-21 LAB — BASIC METABOLIC PANEL
Anion gap: 7 (ref 5–15)
BUN: 15 mg/dL (ref 6–20)
CHLORIDE: 105 mmol/L (ref 101–111)
CO2: 29 mmol/L (ref 22–32)
Calcium: 9.6 mg/dL (ref 8.9–10.3)
Creatinine, Ser: 0.78 mg/dL (ref 0.44–1.00)
GFR calc Af Amer: >60 mL/min (ref >60)
GFR calc non Af Amer: >60 mL/min (ref >60)
Glucose, Bld: 118 mg/dL — ABNORMAL HIGH (ref 65–99)
POTASSIUM: 5.1 mmol/L (ref 3.5–5.1)
SODIUM: 141 mmol/L (ref 135–145)

## 2016-05-21 LAB — CBC WITH DIFFERENTIAL/PLATELET
Basophils Absolute: 0 10*3/uL (ref 0.0–0.1)
Basophils Relative: 0 %
EOS PCT: 1 %
Eosinophils Absolute: 0.1 10*3/uL (ref 0.0–0.7)
HCT: 41.2 % (ref 36.0–46.0)
HEMOGLOBIN: 12.9 g/dL (ref 12.0–15.0)
LYMPHS ABS: 2.2 10*3/uL (ref 0.7–4.0)
LYMPHS PCT: 31 %
MCH: 28 pg (ref 26.0–34.0)
MCHC: 31.3 g/dL (ref 30.0–36.0)
MCV: 89.6 fL (ref 78.0–100.0)
MONOS PCT: 6 %
Monocytes Absolute: 0.4 10*3/uL (ref 0.1–1.0)
Neutro Abs: 4.4 10*3/uL (ref 1.7–7.7)
Neutrophils Relative %: 62 %
PLATELETS: 259 10*3/uL (ref 150–400)
RBC: 4.6 MIL/uL (ref 3.87–5.11)
RDW: 12.6 % (ref 11.5–15.5)
WBC: 7.1 10*3/uL (ref 4.0–10.5)

## 2016-05-21 MED ORDER — POLYETHYLENE GLYCOL 3350 17 G PO PACK: 17.0000 g | PACK | Freq: Every day | ORAL | 0 refills | Status: DC

## 2016-05-21 MED ORDER — IOPAMIDOL (ISOVUE-300) INJECTION 61%
80.0000 mL | Freq: Once | INTRAVENOUS | Status: AC | PRN
  Administered 2016-05-21: 80 mL via INTRAVENOUS

## 2016-05-21 NOTE — Discharge Instructions (Signed)
Your ct scans were negative. 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 Return for any new or worsening symptoms.
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
## 2016-05-21 NOTE — ED Notes (Signed)
Two unsuccessful attempt with labs. RN have been made aware

## 2016-05-21 NOTE — ED Provider Notes (Signed)
[facility]-EMERGENCY DEPT Provider Note   CSN: [id number] Arrival date & time: 05/21/16  1037     History   Chief Complaint Chief Complaint  Patient presents with  . Headache  . Neck Pain    HPI Vickie Kim is a 70 y.o. female who  has a past medical history of Acid reflux; Arthritis; Carpal tunnel syndrome; Diabetes mellitus without complication (HCC); Early-onset Parkinson's disease (HCC); Glaucoma; Hypertension; Polyp of colon; Thyroid disease; Urine incontinence; and Evelene Croon Parkinson White pattern seen on electrocardiogram. she presents with c/o tinnitus, dizziness, and neck pain. Patient states on 05/01/16 she was at a [employer] giving a urine sample when she stood up and hit the top of her head on a cabinet that was located above the toilet. Patient reports immediate pain and states her vision went black for a few seconds but denies loss of consciousness. She states she was not concerned at this time and did not seek medical attention. Two days later, she began experiencing constant bilateral tinnitus which she describes as "buzzing"  And "whiring" sounds like "air blowing in my ears" along with neck pain and spasms that have been gradually worsening. States neck pain occurs laterally and at the base of the skull. She also relays she began experiencing bouts of mild vertigo that occur 2-3 times per day and only last for about 30 seconds. She denies vertiginous symptoms.  She states it feels as if "one side of my head is heavier and everything inside is going that way." States the dizziness is exacerbated by turning her head laterally, especially to the left. Pt endorses she experienced left ear pain upon turning head to left but it has since resolved. She states she has felt weak since the hitting her head and finds it difficult to keep her eyes open. Patient also relays this morning at 4 am she woke up and the left side of her lower lip as well as her left cheek was swollen but  denies shortness of breath or associated urticaria.  She denies hx of angioedema,or dental infections. Pt denies any trauma to the area or ingesting any foods to which she has known allergies to. She did begin taking vesicare this week for overactive bladder. Denies vision changes, hearing loss, difficulty speaking, muscle weakness, gait abnormalities, changes in memory, and difficulty concentrating.   HPI  Past Medical History:  Diagnosis Date  . Acid reflux   . Arthritis   . Carpal tunnel syndrome   . Diabetes mellitus without complication (HCC)   . Early-onset Parkinson's disease (HCC)   . Glaucoma   . Hypertension   . Polyp of colon   . Thyroid disease    Hypothyroid  . Urine incontinence   . Evelene Croon Parkinson White pattern seen on electrocardiogram     There are no active problems to display for this patient.   Past Surgical History:  Procedure Laterality Date  . ABDOMINAL HYSTERECTOMY    . BREAST SURGERY     lumpectomy  . VASCULAR SURGERY      OB History    No data available       Home Medications    Prior to Admission medications   Medication Sig Start Date End Date Taking? Authorizing Provider  acetaminophen (TYLENOL) 500 MG tablet Take 1,000 mg by mouth every 6 (six) hours as needed for moderate pain.   Yes Historical Provider, MD  amitriptyline (ELAVIL) 25 MG tablet Take 25 mg by mouth at bedtime as needed for sleep.  Yes Historical Provider, MD  aspirin EC 81 MG tablet Take 81 mg by mouth daily.   Yes Historical Provider, MD  B Complex-C (B-COMPLEX WITH VITAMIN C) tablet Take 1 tablet by mouth daily.   Yes Historical Provider, MD  BLACK CURRANT SEED OIL PO Take 2 capsules by mouth 2 (two) times daily.   Yes Historical Provider, MD  Coenzyme Q10 (CO Q 10 PO) Take 1 tablet by mouth daily.   Yes Historical Provider, MD  diphenhydrAMINE (BENADRYL) 25 mg capsule Take 25 mg by mouth every 6 (six) hours as needed for itching.   Yes Historical Provider, MD    EPINEPHrine 0.3 mg/0.3 mL IJ SOAJ injection Inject 0.3 mLs (0.3 mg total) into the muscle once. Patient taking differently: Inject 0.3 mg into the muscle once as needed. Allergic Reaction 02/14/16  Yes Jerre Simon, PA  gabapentin (NEURONTIN) 300 MG capsule Take 300 mg by mouth at bedtime.   Yes Historical Provider, MD  latanoprost (XALATAN) 0.005 % ophthalmic solution Place 1 drop into both eyes at bedtime.   Yes Historical Provider, MD  levothyroxine (SYNTHROID, LEVOTHROID) 50 MCG tablet Take 50 mcg by mouth daily before breakfast.   Yes Historical Provider, MD  NIFEdipine (PROCARDIA) 20 MG capsule Take 20 mg by mouth daily.   Yes Historical Provider, MD  silver sulfADIAZINE (SILVADENE) 1 % cream Apply 1 application topically 2 (two) times daily.   Yes Historical Provider, MD  simvastatin (ZOCOR) 40 MG tablet Take 40 mg by mouth daily.   Yes Historical Provider, MD  Skin Protectants, Misc. (EUCERIN) cream Apply 1 application topically 2 (two) times daily.   Yes Historical Provider, MD  solifenacin (VESICARE) 5 MG tablet Take 5 mg by mouth daily.   Yes Historical Provider, MD  spironolactone (ALDACTONE) 50 MG tablet Take 50 mg by mouth daily.   Yes Historical Provider, MD  TURMERIC PO Take 5 mLs by mouth every other day.   Yes Historical Provider, MD  cephALEXin (KEFLEX) 500 MG capsule Take 1 capsule (500 mg total) by mouth 2 (two) times daily. Patient not taking: Reported on 05/21/2016 03/15/16   Gerhard Munch, MD  furosemide (LASIX) 20 MG tablet Take 1 tablet (20 mg total) by mouth daily. Patient not taking: Reported on 05/21/2016 12/25/15   Rolan Bucco, MD  polyethylene glycol (MIRALAX / GLYCOLAX) packet Take 17 g by mouth daily. 05/21/16   Arthor Captain, PA-C    Family History History reviewed. No pertinent family history.  Social History Social History  Substance Use Topics  . Smoking status: Former Games developer  . Smokeless tobacco: Never Used  . Alcohol use No     Allergies    Gluten meal; Codeine; and Wheat bran   Review of Systems Review of Systems Ten systems reviewed and are negative for acute change, except as noted in the HPI.    Physical Exam Updated Vital Signs BP 120/93   Pulse 76   Temp 97.9 F (36.6 C) (Oral)   Resp 18   SpO2 100%   Physical Exam  Constitutional: She is oriented to person, place, and time. She appears well-developed and well-nourished. No distress.  HENT:  Head: Normocephalic and atraumatic.  Right Ear: External ear normal.  Left Ear: External ear normal.  Mouth/Throat: Oropharynx is clear and moist. No oropharyngeal exudate.  BL TMs normal. No vesicals in the ears. No vesicals in the face or nose. Mild swelling in the  Eyes: Conjunctivae and EOM are normal. Pupils are equal, round,  and reactive to light. No scleral icterus.  No horizontal, vertical or rotational nystagmus  Neck: Normal range of motion. Neck supple. No JVD present. No thyromegaly present.  During exam patient developed acute spasm in the BL Levator scapulae . She is immediate severe pain in the neck and lower "Whiring noise" sensation in bilateral ears  No midline cervical tenderness  Cardiovascular: Normal rate, regular rhythm, normal heart sounds and intact distal pulses.  Exam reveals no gallop and no friction rub.   No murmur heard. Pulmonary/Chest: Effort normal and breath sounds normal. No respiratory distress. She has no wheezes. She has no rales.  Abdominal: Soft. Bowel sounds are normal. She exhibits no distension and no mass. There is no tenderness. There is no rebound and no guarding.  Musculoskeletal: Normal range of motion. She exhibits no edema or tenderness.  No meningismus  Lymphadenopathy:    She has no cervical adenopathy.  Neurological: She is alert and oriented to person, place, and time. She has normal reflexes. No cranial nerve deficit. She exhibits normal muscle tone. Coordination normal.  Mental Status:  Alert, oriented,  thought content appropriate. Speech fluent without evidence of aphasia. Able to follow 2 step commands without difficulty.  Cranial Nerves:  II:  Peripheral visual fields grossly normal, pupils equal, round, reactive to light III,IV, VI: ptosis not present, extra-ocular motions intact bilaterally  V,VII: smile symmetric, facial light touch sensation equal VIII: hearing grossly normal bilaterally  IX,X: midline uvula rise  XI: bilateral shoulder shrug equal and strong XII: midline tongue extension  Motor:  5/5 in upper and lower extremities bilaterally including strong and equal grip strength and dorsiflexion/plantar flexion Sensory: Pinprick and light touch normal in all extremities.  Deep Tendon Reflexes: 2+ and symmetric  Cerebellar: normal finger-to-nose with bilateral upper extremities Gait: normal gait and balance CV: distal pulses palpable throughout   Skin: Skin is warm and dry. No rash noted. She is not diaphoretic.  Psychiatric: She has a normal mood and affect. Her behavior is normal. Judgment and thought content normal.  Nursing note and vitals reviewed.    ED Treatments / Results  Labs (all labs ordered are listed, but only abnormal results are displayed) Labs Reviewed  BASIC METABOLIC PANEL - Abnormal; Notable for the following:       Result Value   Glucose, Bld 118 (*)    All other components within normal limits  I-STAT CHEM 8, ED - Abnormal; Notable for the following:    Glucose, Bld 115 (*)    Calcium, Ion 1.12 (*)    All other components within normal limits  CBC WITH DIFFERENTIAL/PLATELET    EKG  EKG Interpretation None       Radiology Ct Head Wo Contrast  Result Date: 05/21/2016 CLINICAL DATA:  Facial and neck swelling, spasm. EXAM: CT HEAD WITHOUT CONTRAST TECHNIQUE: Contiguous axial images were obtained from the base of the skull through the vertex without intravenous contrast. COMPARISON:  None. FINDINGS: Brain: There is mild central and cortical  atrophy. Mild periventricular white matter changes are consistent with small vessel disease. There is no intra or extra-axial fluid collection or mass lesion. The basilar cisterns and ventricles have a normal appearance. There is no CT evidence for acute infarction or hemorrhage. Vascular: No hyperdense vessel or unexpected calcification. Skull: Normal. Negative for fracture or focal lesion. Sinuses/Orbits: No acute finding. Other: None IMPRESSION: 1. Atrophy and small vessel disease. 2.  No evidence for acute intracranial abnormality. Electronically Signed   By: Lanora Manis  Manson PasseyBrown M.D.   On: 05/21/2016 16:27   Ct Soft Tissue Neck W Contrast  Result Date: 05/21/2016 CLINICAL DATA:  Head neck pain after striking top of head 2 weeks ago. No loss of consciousness. Left-sided mandible and neck swelling. EXAM: CT NECK WITH CONTRAST TECHNIQUE: Multidetector CT imaging of the neck was performed using the standard protocol following the bolus administration of intravenous contrast. CONTRAST:  80mL ISOVUE-300 IOPAMIDOL (ISOVUE-300) INJECTION 61% COMPARISON:  PET-CT today FINDINGS: Pharynx and larynx: Normal. No mass or swelling. Salivary glands: No inflammation, mass, or stone. Thyroid: Normal. Lymph nodes: None enlarged or abnormal density. Vascular: Negative. Limited intracranial: Negative. Visualized orbits: Negative. Mastoids and visualized paranasal sinuses: Air-fluid level within the left maxillary sinus. Left sphenoid air cell shows mucoperiosteal thickening. The mastoid air cells are normally aerated. Skeleton: Degenerative changes in the mid cervical spine. Upper chest: Negative Other: None IMPRESSION: No suspicious mass or adenopathy. Sinus disease. Electronically Signed   By: Norva PavlovElizabeth  Brown M.D.   On: 05/21/2016 16:40   Ct C-spine No Charge  Result Date: 05/21/2016 CLINICAL DATA:  Left all pain and swelling. Patient struck top over head 2 weeks ago. EXAM: CT CERVICAL SPINE WITHOUT CONTRAST TECHNIQUE:  Multidetector CT imaging of the cervical spine was performed without intravenous contrast. Multiplanar CT image reconstructions were also generated. COMPARISON:  05/21/2016 FINDINGS: Alignment: There is reversal of cervical lordosis, associated moderate degenerative change in the mid cervical levels, primarily at C5-6, C6-7, and C7-T1. No acute fracture. Skull base and vertebrae: No acute fracture. Sinus disease noted in the left maxillary sinus and left sphenoid air cell. Soft tissues and spinal canal: Unremarkable. Disc levels:  Disc height loss at C5-6, C6-7, C7-T1. Upper chest: Unremarkable. Other: None IMPRESSION: Moderate mid cervical degenerative changes. Reversal of cervical lordosis. No acute fracture. Electronically Signed   By: Norva PavlovElizabeth  Brown M.D.   On: 05/21/2016 16:44    Procedures Procedures (including critical care time)  Medications Ordered in ED Medications  iopamidol (ISOVUE-300) 61 % injection 80 mL (80 mLs Intravenous Contrast Given 05/21/16 1550)     Initial Impression / Assessment and Plan / ED Course  I have reviewed the triage vital signs and the nursing notes.  Pertinent labs & imaging results that were available during my care of the patient were reviewed by me and considered in my medical decision making (see chart for details).  Clinical Course  patient workup negative for acute abnormality. No signs of infection in the soft tissue of the neck. No obvious fractures.  I suspect post concussive syndrome. No evidence of herpetic infection (zoster on exam). Patient is having acute spasms, which could potentially be causing her tendon tenderness. There is no emergent cause of her symptoms that I can see on workup here today. Patient will be discharged to follow up with her ear, nose and throat physician as well as her primary care doctor. Her lip swelling is resolved and I have advised her to stop taking Vesicare. Discussed return precautions to include worsening swelling,  shortness of breath. No neurologic symptoms. She received for discharge at this time Patient seen in shared visit with attending physician.    Final Clinical Impressions(s) / ED Diagnoses   Final diagnoses:  Injury of head, initial encounter  Post concussive syndrome  Tinnitus of both ears  Neck muscle spasm  Constipation, unspecified constipation type    New Prescriptions Discharge Medication List as of 05/21/2016  5:44 PM    START taking these medications   Details  polyethylene glycol (MIRALAX / GLYCOLAX) packet Take 17 g by mouth daily., Starting Sun 05/21/2016, Print         Arthor Captain, PA-C 05/23/16 1058

## 2016-05-21 NOTE — ED Provider Notes (Signed)
Medical screening examination/treatment/procedure(s) were conducted as a shared visit with non-physician practitioner(s) and myself.  I personally evaluated the patient during the encounter.   EKG Interpretation None     Patient here complaining of head and neck pain after sustaining trauma 20 days ago. X-rays are reassuring in no acute findings. Will be given ENT referral due to her persistent tinnitus   Lorre NickAnthony Shariff Lasky, MD 05/21/16 1724

## 2016-05-21 NOTE — ED Notes (Signed)
Patient stating that her head, ears, and neck hurts from a fall two weeks ago. No abnormalities found on head, ears or neck.

## 2017-05-21 ENCOUNTER — Other Ambulatory Visit: Payer: Self-pay | Admitting: Family Medicine

## 2017-05-21 DIAGNOSIS — M5431 Sciatica, right side: Secondary | ICD-10-CM

## 2017-05-29 ENCOUNTER — Encounter: Payer: Self-pay | Admitting: Neurology

## 2017-05-29 ENCOUNTER — Ambulatory Visit (INDEPENDENT_AMBULATORY_CARE_PROVIDER_SITE_OTHER): Payer: Medicare Other | Admitting: Neurology

## 2017-05-29 VITALS — BP 107/74 | HR 69 | Ht 63.5 in | Wt 143.0 lb

## 2017-05-29 DIAGNOSIS — H538 Other visual disturbances: Secondary | ICD-10-CM | POA: Diagnosis not present

## 2017-05-29 DIAGNOSIS — R1319 Other dysphagia: Secondary | ICD-10-CM | POA: Insufficient documentation

## 2017-05-29 DIAGNOSIS — R531 Weakness: Secondary | ICD-10-CM

## 2017-05-29 NOTE — Progress Notes (Signed)
PATIENT: Vickie Kim DOB: 1946/06/15  Chief Complaint  Patient presents with  . Dysphagia    Reports difficulty swallowing over the last six months.  She is still able to eat solid foods but has noticed increased problems taking her oral medications.  She will occasionally choke on her saliva or when drinking.  Marland Kitchen PCP    Tally Joe, MD     HISTORICAL  Vickie Kim a 71 years old female, seen in refer by primary care doctor Tally Joe for evaluation of swallowing difficulty, initial evaluation was May 29 2017.  I reviewed and summarized the referring note, she has history of hyperlipidemia, hypertension, hypothyroidism, type 2 diabetes, peripheral neuropathy, insomnia, celiac disease, overactive bladder, She was diagnosed with celiac disease since February 2018, presented with frequent skin rash, swollen lips, diagnosis was confirmed by laboratory evaluation per patient, she went on gluten-free diet, over the past 3 months, she has lost more than 20 pounds,  Since August 2017, he noticed intermittent difficulty swallowing water, symptoms were intermittent, since January 2018, she also developed intermittent choking episode, difficulty swallowing bigger tablets, she denies dysarthria, no difficulty swallowing solid food, blurry vision that was corrected by her new pair of glasses.  She complains of generalized weakness, noticed intermittent left arm muscle fasciculations. She also complains of chronic low back pain, MRI of lumbar is pending from her primary care doctor, difficulty sleeping in the lying down position, as sleeping a recliner for few years.  Laboratory evaluations in October 2018, normal TSH, LDL was 74, negative HIV, Z6X was 6.3, CBC showed hemoglobin of 13.6, normal CMP, creatinine of 0.9,   REVIEW OF SYSTEMS: Full 14 system review of systems performed and notable only for weight loss, murmur, swelling legs, hearing loss, ringing ears,  blurry vision, loss of vision, incontinence, constipation, easy bruising, bleeding feeling cold, joint pain swelling cramps, allergy runny nose skin sensitivity, memory loss numbness difficulty swallowing dizziness anxiety decreased energy disinterested in activities   ALLERGIES: Allergies  Allergen Reactions  . Gluten Meal Swelling and Rash  . Codeine Rash  . Wheat Bran Swelling and Rash    HOME MEDICATIONS: Current Outpatient Prescriptions  Medication Sig Dispense Refill  . acetaminophen (TYLENOL) 500 MG tablet Take 1,000 mg by mouth every 6 (six) hours as needed for moderate pain.    Marland Kitchen amitriptyline (ELAVIL) 25 MG tablet Take 25 mg by mouth at bedtime as needed for sleep.    Marland Kitchen aspirin EC 81 MG tablet Take 81 mg by mouth daily.    . B Complex-C (B-COMPLEX WITH VITAMIN C) tablet Take 1 tablet by mouth daily.    . Coenzyme Q10 (CO Q 10 PO) Take 1 tablet by mouth daily.    . diphenhydrAMINE (BENADRYL) 25 mg capsule Take 25 mg by mouth every 6 (six) hours as needed for itching.    Marland Kitchen EPINEPHrine 0.3 mg/0.3 mL IJ SOAJ injection Inject 0.3 mLs (0.3 mg total) into the muscle once. (Patient taking differently: Inject 0.3 mg into the muscle once as needed. Allergic Reaction) 1 Device 1  . latanoprost (XALATAN) 0.005 % ophthalmic solution Place 1 drop into both eyes at bedtime.    Marland Kitchen levothyroxine (SYNTHROID, LEVOTHROID) 50 MCG tablet Take 50 mcg by mouth daily before breakfast.    . silver sulfADIAZINE (SILVADENE) 1 % cream Apply 1 application topically 2 (two) times daily.    . Skin Protectants, Misc. (EUCERIN) cream Apply 1 application topically 2 (two) times daily.    . solifenacin (  VESICARE) 5 MG tablet Take 5 mg by mouth daily.    . TURMERIC PO Take 5 mLs by mouth every other day.     No current facility-administered medications for this visit.     PAST MEDICAL HISTORY: Past Medical History:  Diagnosis Date  . Acid reflux   . Arthritis   . Carpal tunnel syndrome   . Celiac disease     . Diabetes mellitus without complication (HCC)   . Early-onset Parkinson's disease (HCC)   . Glaucoma   . Hypertension   . Polyp of colon   . Thyroid disease    Hypothyroid  . Urine incontinence   . Evelene Croon Parkinson White pattern seen on electrocardiogram     PAST SURGICAL HISTORY: Past Surgical History:  Procedure Laterality Date  . ABDOMINAL HYSTERECTOMY    . BREAST SURGERY     lumpectomy  . VASCULAR SURGERY      FAMILY HISTORY: Family History  Problem Relation Age of Onset  . Rheumatic fever Mother   . Hypertension Mother   . Diverticulitis Mother   . Arthritis Mother   . Throat cancer Father     SOCIAL HISTORY:  Social History   Social History  . Marital status: Married    Spouse name: N/A  . Number of children: 2  . Years of education: some college   Occupational History  . Retired    Social History Main Topics  . Smoking status: Former Games developer  . Smokeless tobacco: Never Used     Comment: Reports smoking for less than one year.  . Alcohol use No  . Drug use: No  . Sexual activity: Not on file   Other Topics Concern  . Not on file   Social History Narrative   Lives at home with her son.   Right-handed.   No caffeine use.     PHYSICAL EXAM   Vitals:   05/29/17 1328  Weight: 143 lb (64.9 kg)  Height: 5' 3.5" (1.613 m)    Not recorded      Body mass index is 24.93 kg/m.  PHYSICAL EXAMNIATION:  Gen: NAD, conversant, well nourised, obese, well groomed                     Cardiovascular: Regular rate rhythm, no peripheral edema, warm, nontender. Eyes: Conjunctivae clear without exudates or hemorrhage Neck: Supple, no carotid bruits. Pulmonary: Clear to auscultation bilaterally   NEUROLOGICAL EXAM:  MENTAL STATUS: Speech:    Speech is normal; fluent and spontaneous with normal comprehension.  Cognition:     Orientation to time, place and person     Normal recent and remote memory     Normal Attention span and concentration      Normal Language, naming, repeating,spontaneous speech     Fund of knowledge   CRANIAL NERVES: CN II: Visual fields are full to confrontation. Fundoscopic exam is normal with sharp discs and no vascular changes. Pupils are round equal and briskly reactive to light. CN III, IV, VI: Cover and uncover testing showed mild bilateral exophoria. No ptosis. CN V: Facial sensation is intact to pinprick in all 3 divisions bilaterally. Corneal responses are intact.  CN VII: Face is symmetric, she has mild eye-closure cheek puff weakness CN VIII: Hearing is normal to rubbing fingers CN IX, X: Palate elevates symmetrically. Phonation is normal. CN XI: Head turning and shoulder shrug are intact CN XII: Tongue is midline with normal movements and no atrophy.  MOTOR: She  has mild neck flexion, bilateral shoulder abduction hip flexion weakness  REFLEXES: Reflexes are3 and symmetric at the biceps, triceps, knees, and ankles. Plantar responses are flexor.  SENSORY: Intact to light touch, pinprick, positional sensation and vibratory sensation are intact in fingers and toes.  COORDINATION: Rapid alternating movements and fine finger movements are intact. There is no dysmetria on finger-to-nose and heel-knee-shin.    GAIT/STANCE: She needs push up to get up from seated position, mildly antalgic due to left foot pain   DIAGNOSTIC DATA (LABS, IMAGING, TESTING) - I reviewed patient records, labs, notes, testing and imaging myself where available.   ASSESSMENT AND PLAN  Vickie Kim is a 71 y.o. female   Mild bilateral facial muscle and limb muscle weakness, blurry vision  Need to rule out neuromuscular junctional disorder, inflammatory myopathy, CNS etiology  Laboratory evaluation including CPK, acetylcholine receptor antibody  MRI of the brain  EMG/NCS   Levert FeinsteinYijun Kayia Billinger, M.D. Ph.D.  [facility] [address], [address] [place], [place] [address] Ph: [phone] Fax:  [phone]  CC: Tally JoeSwayne, David, MD

## 2017-06-01 LAB — HEPATITIS PANEL, ACUTE
HEP A IGM: NEGATIVE
HEP B C IGM: NEGATIVE
HEP B S AG: NEGATIVE

## 2017-06-01 LAB — VITAMIN B12: Vitamin B-12: >2000 pg/mL — ABNORMAL HIGH (ref 232–1245)

## 2017-06-01 LAB — MYASTHENIA GRAVIS FULL PANEL
ANTI-STRIATION ABS: NEGATIVE
Acetylchol Block Ab: 15 % (ref 0–25)

## 2017-06-01 LAB — CK: Total CK: 218 U/L — ABNORMAL HIGH (ref 24–173)

## 2017-06-01 LAB — RPR: RPR Ser Ql: NONREACTIVE

## 2017-06-01 LAB — VITAMIN D 25 HYDROXY (VIT D DEFICIENCY, FRACTURES): Vit D, 25-Hydroxy: 28.3 ng/mL — ABNORMAL LOW (ref 30.0–100.0)

## 2017-06-01 LAB — ANA W/REFLEX: ANA: NEGATIVE

## 2017-06-01 LAB — C-REACTIVE PROTEIN: CRP: 0.8 mg/L (ref 0.0–4.9)

## 2017-06-01 LAB — FOLATE: Folate: 18.5 ng/mL (ref >3.0)

## 2017-06-01 LAB — SEDIMENTATION RATE: SED RATE: 25 mm/h (ref 0–40)

## 2017-06-06 ENCOUNTER — Ambulatory Visit
Admission: RE | Admit: 2017-06-06 | Discharge: 2017-06-06 | Disposition: A | Discharge status: Home / Self Care | Payer: Medicare Other | Source: Ambulatory Visit | Attending: Family Medicine | Admitting: Family Medicine

## 2017-06-06 DIAGNOSIS — M5431 Sciatica, right side: Secondary | ICD-10-CM

## 2017-06-11 ENCOUNTER — Ambulatory Visit
Admission: RE | Admit: 2017-06-11 | Discharge: 2017-06-11 | Disposition: A | Discharge status: Home / Self Care | Payer: Medicare Other | Source: Ambulatory Visit | Attending: Neurology | Admitting: Neurology

## 2017-06-11 DIAGNOSIS — R1319 Other dysphagia: Secondary | ICD-10-CM | POA: Diagnosis not present

## 2017-06-12 ENCOUNTER — Telehealth: Payer: Self-pay | Admitting: Neurology

## 2017-06-12 NOTE — Telephone Encounter (Addendum)
Also, spoke to patient and she is aware of results.  She is leaving the country in the morning for several months.  She will call back to reschedule a follow up appt when she returns from her trip.

## 2017-06-12 NOTE — Telephone Encounter (Signed)
Spoke to her son, Caryn BeeKevin, on HIPAA.  He verbalized understanding of results and will provide them to his mother.  He has our office number in case she has questions and would like to call us back.  Additionally, he will let her know that a follow up appt should be scheduled and we are happy to find a convenient time for her.

## 2017-06-12 NOTE — Telephone Encounter (Signed)
MRI of the brain showed age-related changes no acute abnormality, MRI of lumbar spine showed multilevel degenerative disc disease, with variable degree of foraminal narrowing, but there is no evidence of nerve root compression. I will review MRIs with her at next follow-up visit    IMPRESSION:  This MRI of the brain without contrast shows the following: 1.    Mild generalized cortical atrophy most pronounced in the parietal lobes. 2.    Mild age-related chronic microvascular ischemic change.   3.    The sella turcica is mildly enlargement the pituitary gland appears normal. This is likely an incidental finding. If papilledema is present, however, consider idiopathic intracranial hypertention. 4.    There are no acute findings. IMPRESSION: 1. Moderately severe facet arthritis at L4-5 with grade 1 spondylolisthesis but without neural impingement or spinal stenosis. 2. Small disc protrusion to the right of midline at L5-S1 without neural impingement. 3. Degenerative changes of the facet joints at L3-4 and L5-S1.

## 2018-05-09 ENCOUNTER — Ambulatory Visit (INDEPENDENT_AMBULATORY_CARE_PROVIDER_SITE_OTHER): Payer: Medicare Other | Admitting: Podiatry

## 2018-05-09 ENCOUNTER — Encounter: Payer: Self-pay | Admitting: Podiatry

## 2018-05-09 DIAGNOSIS — M2011 Hallux valgus (acquired), right foot: Secondary | ICD-10-CM | POA: Diagnosis not present

## 2018-05-09 DIAGNOSIS — I739 Peripheral vascular disease, unspecified: Secondary | ICD-10-CM

## 2018-05-09 NOTE — Progress Notes (Signed)
Subjective:  Patient ID: Vickie Kim, female    DOB: [birth year],  MRN: [id number] HPI Chief Complaint  Patient presents with  . Toe Pain    Hallux nails bilateral - toenails cracked and eventually fell off x 1-2 months ago, keeps covered with bandaids to cushion when wearing shoes  . New Patient (Initial Visit)    72 y.o. female presents with the above complaint.   ROS: Denies fever chills nausea vomiting muscle aches pains calf pain back pain chest pain shortness of breath.  Past Medical History:  Diagnosis Date  . Acid reflux   . Arthritis   . Carpal tunnel syndrome   . Celiac disease   . Diabetes mellitus without complication (HCC)   . Early-onset Parkinson's disease (HCC)   . Glaucoma   . Hypertension   . Polyp of colon   . Thyroid disease    Hypothyroid  . Urine incontinence   . Evelene Croon Parkinson White pattern seen on electrocardiogram    Past Surgical History:  Procedure Laterality Date  . ABDOMINAL HYSTERECTOMY    . BREAST SURGERY     lumpectomy  . VASCULAR SURGERY      Current Outpatient Medications:  .  Calcium-Magnesium-Vitamin D (CALCIUM 500 PO), Take by mouth., Disp: , Rfl:  .  Cholecalciferol (VITAMIN D-3 PO), Take by mouth., Disp: , Rfl:  .  mupirocin ointment (BACTROBAN) 2 %, Place 1 application into the nose 2 (two) times daily., Disp: , Rfl:  .  Omega-3 Fatty Acids (OMEGA 3 PO), Take by mouth., Disp: , Rfl:  .  aspirin EC 81 MG tablet, Take 81 mg by mouth daily., Disp: , Rfl:  .  B Complex-C (B-COMPLEX WITH VITAMIN C) tablet, Take 1 tablet by mouth daily., Disp: , Rfl:  .  Coenzyme Q10 (CO Q 10 PO), Take 1 tablet by mouth daily., Disp: , Rfl:  .  diphenhydrAMINE (BENADRYL) 25 mg capsule, Take 25 mg by mouth every 6 (six) hours as needed for itching., Disp: , Rfl:  .  EPINEPHrine 0.3 mg/0.3 mL IJ SOAJ injection, Inject 0.3 mLs (0.3 mg total) into the muscle once. (Patient taking differently: Inject 0.3 mg into the muscle once as needed.  Allergic Reaction), Disp: 1 Device, Rfl: 1 .  latanoprost (XALATAN) 0.005 % ophthalmic solution, Place 1 drop into both eyes at bedtime., Disp: , Rfl:  .  levothyroxine (SYNTHROID, LEVOTHROID) 50 MCG tablet, Take 50 mcg by mouth daily before breakfast., Disp: , Rfl:  .  silver sulfADIAZINE (SILVADENE) 1 % cream, Apply 1 application topically 2 (two) times daily., Disp: , Rfl:  .  Skin Protectants, Misc. (EUCERIN) cream, Apply 1 application topically 2 (two) times daily., Disp: , Rfl:  .  solifenacin (VESICARE) 5 MG tablet, Take 5 mg by mouth daily., Disp: , Rfl:  .  TURMERIC PO, Take 5 mLs by mouth every other day., Disp: , Rfl:   Allergies  Allergen Reactions  . Gluten Meal Swelling and Rash  . Codeine Rash  . Wheat Bran Swelling and Rash   Review of Systems Objective:  There were no vitals filed for this visit.  General: Well developed, nourished, in no acute distress, alert and oriented x3   Dermatological: Skin is warm, dry and supple bilateral. Nails x 10 are well maintained; remaining integument appears unremarkable at this time. There are no open sores, no preulcerative lesions, no rash or signs of infection present.  Hallux nails appear to be growing out normally she needs to keep the  ends of the skin soft and supple so that they can continue to grow out normally.  Vascular: Dorsalis Pedis artery and Posterior Tibial artery pedal pulses are 2/4 bilateral with delayed capillary fill time.  Pedal hair growth present. No varicosities and no lower extremity edema present bilateral.   Neruologic: Grossly intact via light touch bilateral. Vibratory intact via tuning fork bilateral. Protective threshold with Semmes Wienstein monofilament intact to all pedal sites bilateral. Patellar and Achilles deep tendon reflexes 2+ bilateral. No Babinski or clonus noted bilateral.   Musculoskeletal: No gross boney pedal deformities bilateral. No pain, crepitus, or limitation noted with foot and ankle  range of motion bilateral. Muscular strength 5/5 in all groups tested bilateral.  Hallux valgus and hammertoe deformities bilateral.  Gait: Unassisted, Nonantalgic.    Radiographs:  None taken  Assessment & Plan:   Assessment: Nail dystrophy hallux valgus and bilateral hammertoe deformities.  Plan: Dispensed toe caps.  She is to follow-up with Dr. Logan Bores for another opinion as to whether or not surgery could or should be done.  I expressed to her that if I feel that her vascular status is probably tenuous and she is not here for long periods of time to have surgery performed.  Dr. Logan Bores will evaluate her and see if there is anything that he can do for her.    Ashden Sonnenberg T. [facility], [place]

## 2018-05-13 ENCOUNTER — Telehealth: Payer: Self-pay | Admitting: Podiatry

## 2018-05-13 NOTE — Telephone Encounter (Signed)
I saw Dr. Al Corpus on Thursday and I got a cover for my big toe where my nail came off. I forgot to ask if I can sleep with the cover on my big toes? Phone number is [phone].

## 2018-05-13 NOTE — Telephone Encounter (Signed)
I spoke with pt's husband, Caryn Bee and he stated call pt on the cell phone.

## 2018-05-13 NOTE — Telephone Encounter (Signed)
I informed pt, she did not have to sleep in the toe caps, to wear them when she was wearing enclosed shoes. Pt states understanding.

## 2018-05-23 DIAGNOSIS — H938X1 Other specified disorders of right ear: Secondary | ICD-10-CM | POA: Insufficient documentation

## 2018-05-24 ENCOUNTER — Other Ambulatory Visit: Payer: Self-pay | Admitting: *Deleted

## 2018-05-24 ENCOUNTER — Ambulatory Visit: Payer: Medicare Other | Admitting: Podiatry

## 2018-05-24 DIAGNOSIS — M79604 Pain in right leg: Secondary | ICD-10-CM

## 2018-05-24 DIAGNOSIS — M79605 Pain in left leg: Principal | ICD-10-CM

## 2018-05-27 ENCOUNTER — Other Ambulatory Visit: Payer: Self-pay

## 2018-05-27 ENCOUNTER — Ambulatory Visit (INDEPENDENT_AMBULATORY_CARE_PROVIDER_SITE_OTHER): Payer: Medicare Other | Admitting: Podiatry

## 2018-05-27 DIAGNOSIS — M2042 Other hammer toe(s) (acquired), left foot: Secondary | ICD-10-CM

## 2018-05-27 DIAGNOSIS — M2041 Other hammer toe(s) (acquired), right foot: Secondary | ICD-10-CM | POA: Diagnosis not present

## 2018-05-27 DIAGNOSIS — M21611 Bunion of right foot: Secondary | ICD-10-CM | POA: Diagnosis not present

## 2018-05-27 DIAGNOSIS — M21612 Bunion of left foot: Secondary | ICD-10-CM | POA: Diagnosis not present

## 2018-05-28 NOTE — Progress Notes (Signed)
   Subjective: 72 year old female presenting today as a referral from Dr. Al Corpus for possible surgical consultation of bilateral bunions and hammertoe repair of digits 2-5 bilaterally. She reports continue pain from the feet secondary to her complaints. She also notes bilateral calluses. Wearing shoes increases her pain. Patient is here for further evaluation and treatment.   Past Medical History:  Diagnosis Date  . Acid reflux   . Arthritis   . Carpal tunnel syndrome   . Celiac disease   . Diabetes mellitus without complication (HCC)   . Early-onset Parkinson's disease (HCC)   . Glaucoma   . Hypertension   . Polyp of colon   . Thyroid disease    Hypothyroid  . Urine incontinence   . Evelene Croon Parkinson White pattern seen on electrocardiogram      Objective: Physical Exam General: The patient is alert and oriented x3 in no acute distress.  Dermatology: Skin is cool, dry and supple bilateral lower extremities. Negative for open lesions or macerations.  Vascular: Diminished pedal pulses bilaterally. No edema or erythema noted. Delayed capillary refill. Skin is cold to the touch.   Neurological: Epicritic and protective threshold grossly intact bilaterally.   Musculoskeletal Exam: Clinical evidence of bunion deformity noted to the respective foot. There is a moderate pain on palpation range of motion of the first MPJ. Lateral deviation of the hallux noted consistent with hallux abductovalgus. Hammertoe contracture also noted on clinical exam to digits 2-5 of the bilateral feet. Symptomatic pain on palpation and range of motion also noted to the metatarsal phalangeal joints of the respective hammertoe digits.    Assessment: 1. HAV w/ bunion deformity bilateral 2. Hammertoe deformity digits 2-5 bilateral    Plan of Care:  1. Patient was evaluated.  2. Recommended conservative management at this time.  3. Patient is not a surgical candidate due to chronic PVD.  4. Recommended good  shoe gear.  5. Padding dispensed.  6. Return to clinic as needed.    Felecia Shelling, DPM [facility]  Dr. Felecia Shelling, DPM    [address]                                        [place], [place] [address]                Office [phone]  Fax [phone]

## 2018-06-25 IMAGING — MR MR LUMBAR SPINE W/O CM
6 series · 38 of 48 positions shown · non-contrast
Comparison: None.

CLINICAL DATA: Chronic progressive low back pain and bilateral leg
pain, right greater than left.

EXAM:
MRI LUMBAR SPINE WITHOUT CONTRAST
TECHNIQUE: Multiplanar, multisequence MR imaging of the lumbar spine was
performed. No intravenous contrast was administered.

[Series 3: T2 · sagittal · 4.0mm · 0.88mm/px · 6 of 12 slices shown (1 of 2)]
[im 1/12]
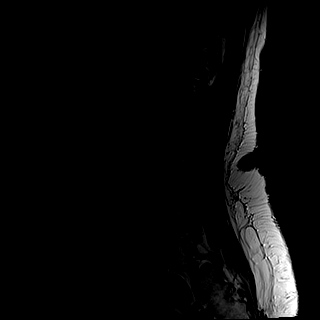
[im 3/12]
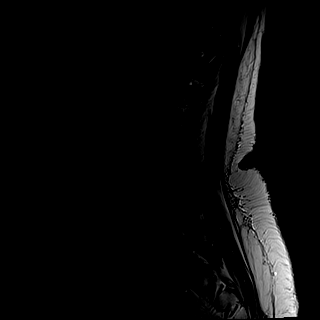
[im 5/12]
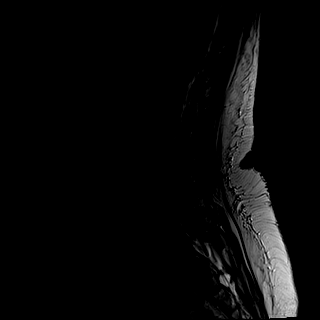
[im 7/12]
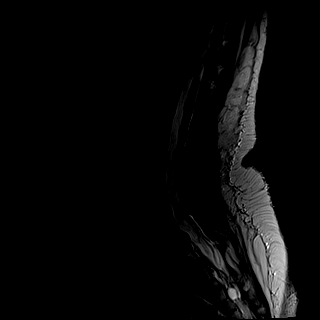
[im 9/12]
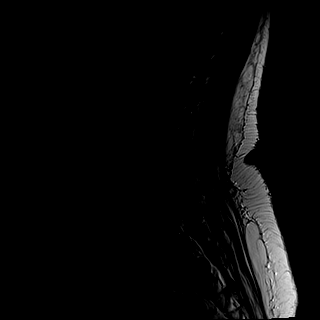
[im 12/12]
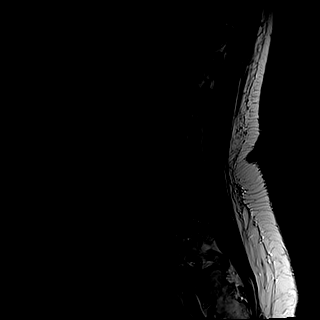

[Series 4: T1 · sagittal · 4.0mm · 0.88mm/px · 6 of 12 slices shown (1 of 2)]
[im 1/12]
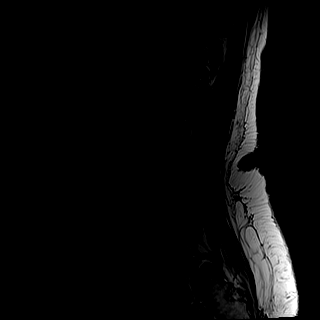
[im 3/12]
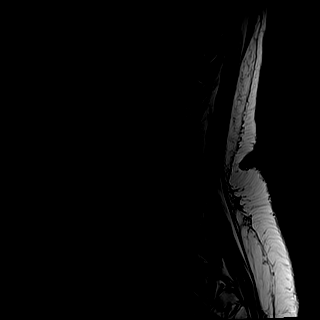
[im 5/12]
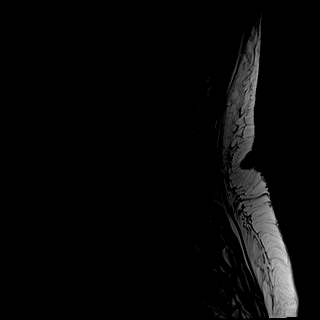
[im 7/12]
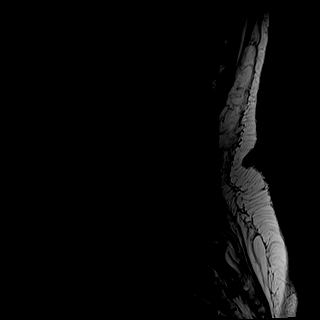
[im 9/12]
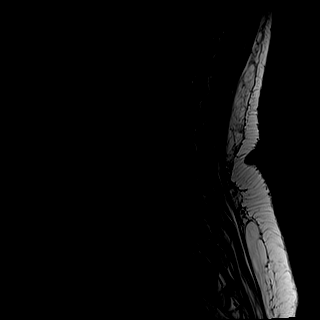
[im 12/12]
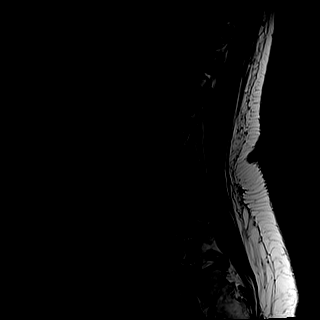

[Series 5: tirm sag · sagittal · 4.0mm · 0.55mm/px · 5 of 12 slices shown (1 of 2)]
[im 1/12]
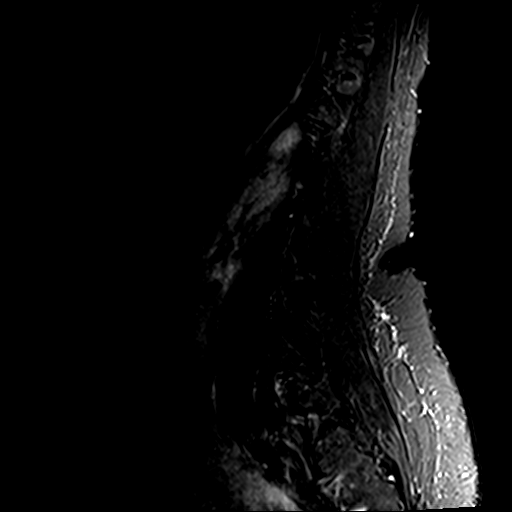
[im 3/12]
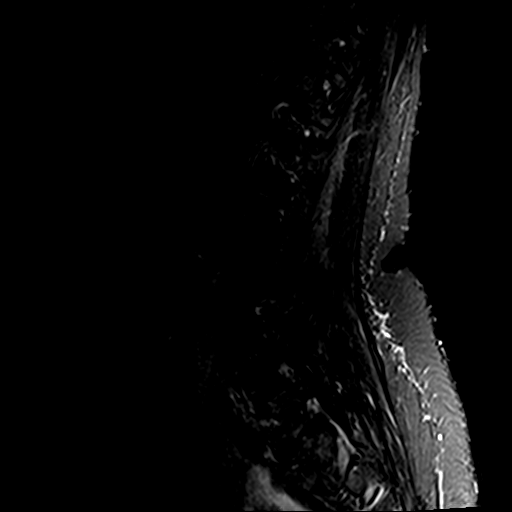
[im 6/12]
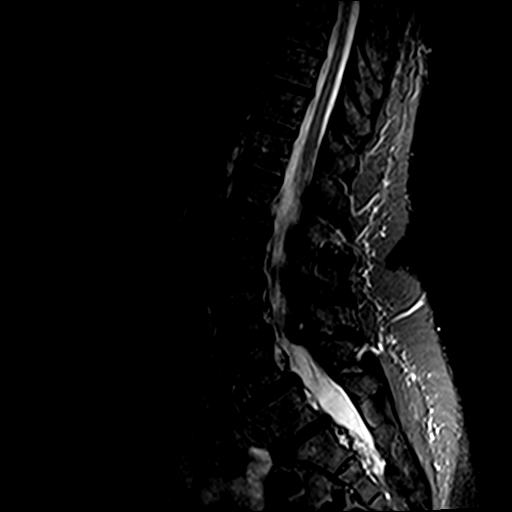
[im 9/12]
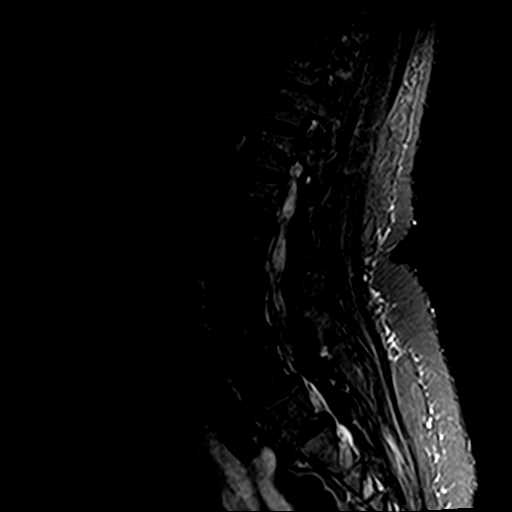
[im 12/12]
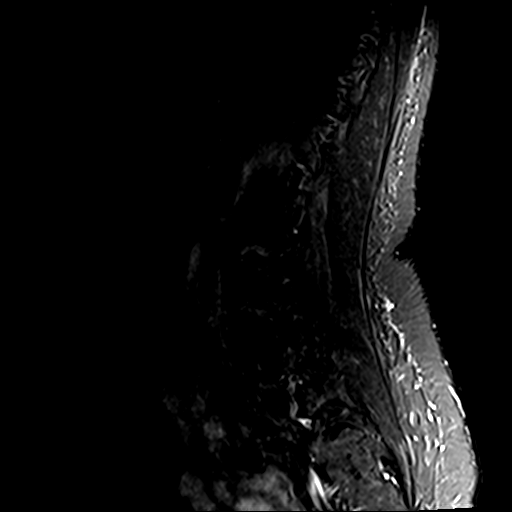

[Series 6: T1 · axial · 4.0mm · 0.78mm/px · z∈[-5,+194]mm · 9 of 30 slices shown (2 of 2)]
[im 1/30]
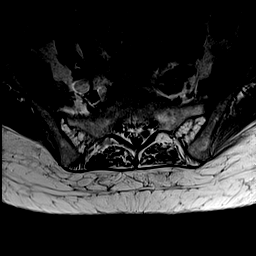
[im 5/30]
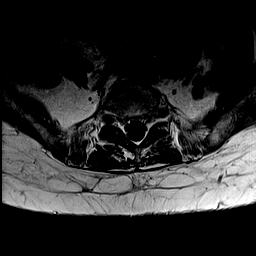
[im 10/30]
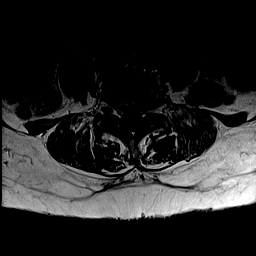
[im 13/30]
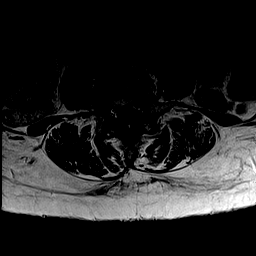
[im 15/30]
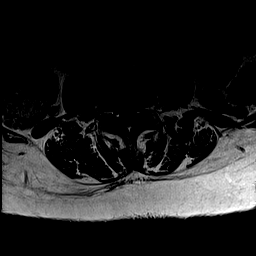
[im 17/30]
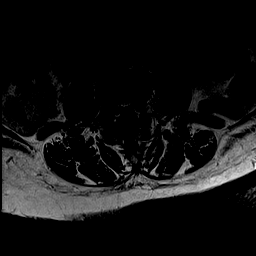
[im 20/30]
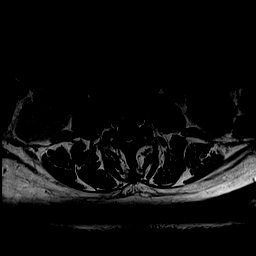
[im 25/30]
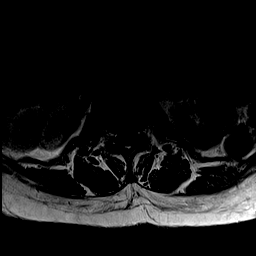
[im 30/30]
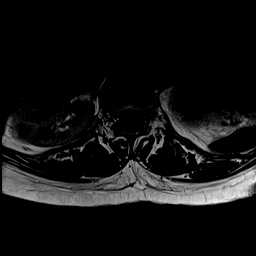

[Series 7: T2 · axial · 4.0mm · 0.78mm/px · z∈[-5,+194]mm · 9 of 30 slices shown (2 of 2)]
[im 1/30]
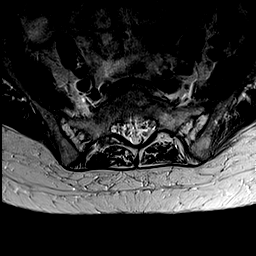
[im 5/30]
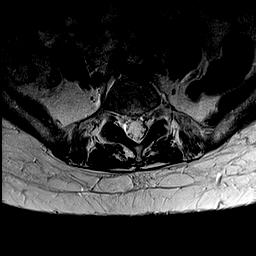
[im 10/30]
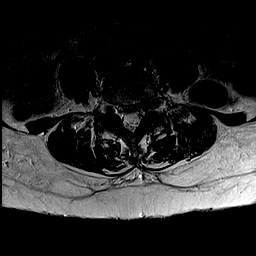
[im 13/30]
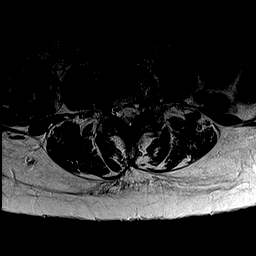
[im 15/30]
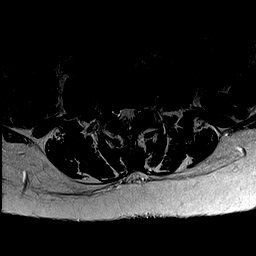
[im 17/30]
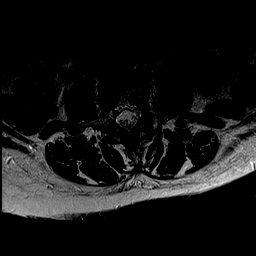
[im 20/30]
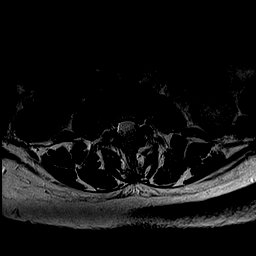
[im 25/30]
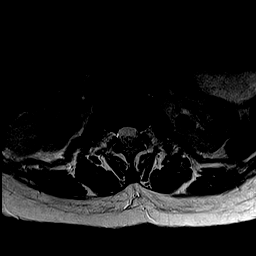
[im 30/30]
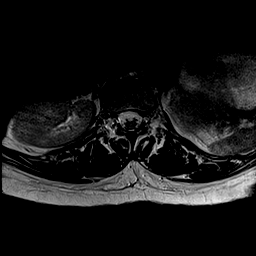

[Series 8: tirm sag · sagittal · 4.0mm · 0.55mm/px · 3 of 12 slices shown (2 of 2)]
[im 1/12]
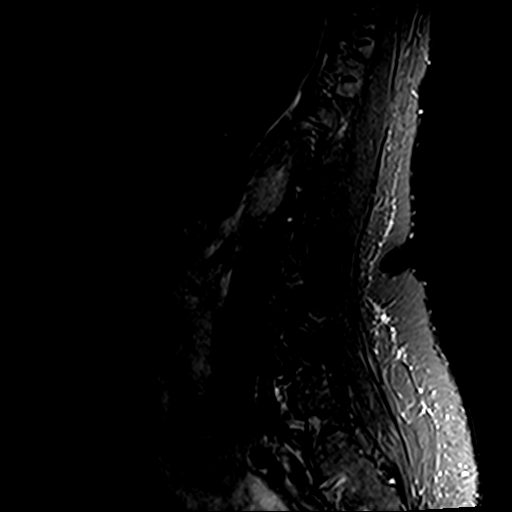
[im 3/12]
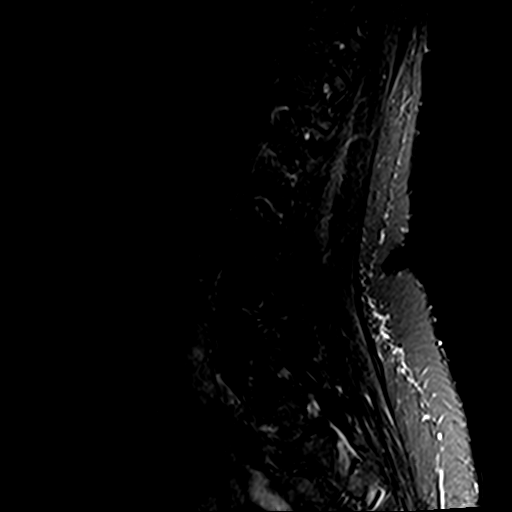
[im 6/12]
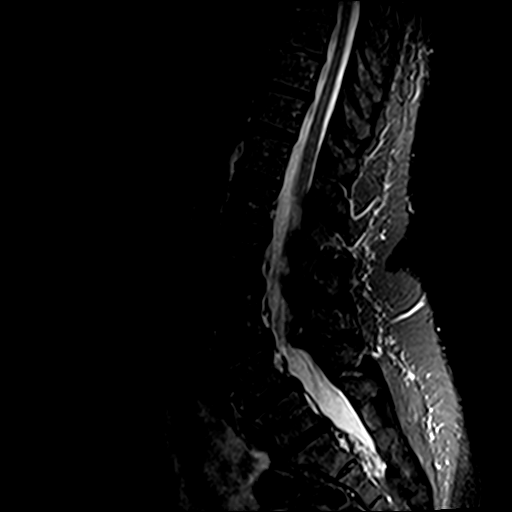

[38 of 48 positions shown; findings below may reference images not displayed]

FINDINGS: Segmentation:  Standard.

Alignment:  5 mm spondylolisthesis at L4-5.

Vertebrae:  No fracture, evidence of discitis, or bone lesion.

Conus medullaris: Extends to the L1-2 level and appears normal.

Paraspinal and other soft tissues: Negative.

Disc levels:

T12-L1 and L1-2: Disc desiccation.  Otherwise negative.

L2-3: Disc desiccation. Minimal disc bulge into the right neural
foramen without neural impingement.

L3-4: Disc desiccation. Small broad-based disc bulge extending into
both neural foramina without neural impingement. Slight degenerative
changes of the facet joints with slight left ligamentum flavum
hypertrophy.

L4-5: Moderately severe bilateral facet arthritis with 5 mm
spondylolisthesis with a broad-based small bulge of the uncovered
disc without neural impingement. No spinal stenosis. Disc
desiccation and disc space narrowing.

L5-S1: Disc desiccation and disc space narrowing. Small broad-based
disc bulge with a small protrusion just to the right of midline
without focal neural impingement. Moderate degenerative changes of
the facet joints.
IMPRESSION: 1. Moderately severe facet arthritis at L4-5 with grade 1
spondylolisthesis but without neural impingement or spinal stenosis.
2. Small disc protrusion to the right of midline at L5-S1 without
neural impingement.
3. Degenerative changes of the facet joints at L3-4 and L5-S1.

## 2018-07-08 ENCOUNTER — Encounter: Payer: Self-pay | Admitting: Surgery

## 2018-07-08 ENCOUNTER — Ambulatory Visit (INDEPENDENT_AMBULATORY_CARE_PROVIDER_SITE_OTHER): Payer: Medicare Other | Admitting: Surgery

## 2018-07-08 ENCOUNTER — Other Ambulatory Visit: Payer: Self-pay

## 2018-07-08 ENCOUNTER — Ambulatory Visit (HOSPITAL_COMMUNITY)
Admission: RE | Admit: 2018-07-08 | Discharge: 2018-07-08 | Disposition: A | Discharge status: Home / Self Care | Payer: Medicare Other | Source: Ambulatory Visit | Attending: Surgery | Admitting: Surgery

## 2018-07-08 VITALS — BP 141/85 | HR 64 | Temp 97.1°F | Resp 14 | Ht 62.0 in | Wt 153.0 lb

## 2018-07-08 DIAGNOSIS — I872 Venous insufficiency (chronic) (peripheral): Secondary | ICD-10-CM

## 2018-07-08 DIAGNOSIS — M79605 Pain in left leg: Secondary | ICD-10-CM | POA: Diagnosis not present

## 2018-07-08 DIAGNOSIS — M79604 Pain in right leg: Secondary | ICD-10-CM | POA: Diagnosis present

## 2018-07-08 NOTE — Progress Notes (Signed)
[facility] [place]  Patient name: Vickie Kim MRN: [id number] DOB: 05/26/1946 Sex: female   REQUESTING PROVIDER:    Dr. Azucena Cecil   REASON FOR CONSULT:    Leg pain  HISTORY OF PRESENT ILLNESS:   Vickie Kim is a 72 y.o. female, who is referred today for evaluation of leg pain which she states is been going on for 3 years.  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  She has been seen in the past at [facility].  She states that she cannot tolerate compression stockings because of how painful they are.  In addition to leg pain, she suffers from swelling and ulcers.  She has also had drainage and discoloration around her ankles.  Patient suffers from hyperlipidemia.  She is on low-cholesterol diet.  She is medically managed for hypertension.  She has type 2 diabetes with neuropathy.  PAST MEDICAL HISTORY    Past Medical History:  Diagnosis Date  . Acid reflux   . Arthritis   . Carpal tunnel syndrome   . Celiac disease   . Diabetes mellitus without complication (HCC)   . Early-onset Parkinson's disease (HCC)   . Glaucoma   . Hypertension   . Polyp of colon   . Thyroid disease    Hypothyroid  . Urine incontinence   . Evelene Croon Parkinson White pattern seen on electrocardiogram      FAMILY HISTORY   Family History  Problem Relation Age of Onset  . Rheumatic fever Mother   . Hypertension Mother   . Diverticulitis Mother   . Arthritis Mother   . Throat cancer Father     SOCIAL HISTORY:   Social History   Socioeconomic History  . Marital status: Married    Spouse name: Not on file  . Number of children: 2  . Years of education: some college  . Highest education level: Not on file  Occupational History  . Occupation: Retired  Engineer, production  . Financial resource strain: Not on file  . Food insecurity:    Worry: Not on file    Inability: Not  on file  . Transportation needs:    Medical: Not on file    Non-medical: Not on file  Tobacco Use  . Smoking status: Former Games developer  . Smokeless tobacco: Never Used  . Tobacco comment: Reports smoking for less than one year.  Substance and Sexual Activity  . Alcohol use: No  . Drug use: No  . Sexual activity: Not on file  Lifestyle  . Physical activity:    Days per week: Not on file    Minutes per session: Not on file  . Stress: Not on file  Relationships  . Social connections:    Talks on phone: Not on file    Gets together: Not on file    Attends religious service: Not on file    Active member of club or organization: Not on file    Attends meetings of clubs or organizations: Not on file    Relationship status: Not on file  . Intimate partner violence:    Fear of current or ex partner: Not on file    Emotionally abused: Not on file    Physically abused: Not on file    Forced sexual activity: Not on file  Other Topics Concern  . Not on file  Social History Narrative   Lives at home with her son.   Right-handed.  No caffeine use.    ALLERGIES:    Allergies  Allergen Reactions  . Gluten Meal Swelling and Rash  . Codeine Rash  . Wheat Bran Swelling and Rash    CURRENT MEDICATIONS:    Current Outpatient Medications  Medication Sig Dispense Refill  . aspirin EC 81 MG tablet Take 81 mg by mouth daily.    . B Complex-C (B-COMPLEX WITH VITAMIN C) tablet Take 1 tablet by mouth daily.    . Calcium-Magnesium-Vitamin D (CALCIUM 500 PO) Take by mouth.    . Cholecalciferol (VITAMIN D-3 PO) Take by mouth.    . Coenzyme Q10 (CO Q 10 PO) Take 1 tablet by mouth daily.    Marland Kitchen. latanoprost (XALATAN) 0.005 % ophthalmic solution Place 1 drop into both eyes at bedtime.    . Omega-3 Fatty Acids (OMEGA 3 PO) Take by mouth.    . Skin Protectants, Misc. (EUCERIN) cream Apply 1 application topically 2 (two) times daily.    . TURMERIC PO Take 5 mLs by mouth every other day.    . b  complex vitamins capsule Take by mouth.    . bifidobacterium infantis (ALIGN) capsule Take by mouth.    . diphenhydrAMINE (BENADRYL) 25 mg capsule Take 25 mg by mouth every 6 (six) hours as needed for itching.    Marland Kitchen. EPINEPHrine 0.3 mg/0.3 mL IJ SOAJ injection Inject 0.3 mLs (0.3 mg total) into the muscle once. (Patient not taking: Reported on 07/08/2018) 1 Device 1  . levothyroxine (SYNTHROID, LEVOTHROID) 50 MCG tablet Take 50 mcg by mouth daily before breakfast.    . mupirocin ointment (BACTROBAN) 2 % Place 1 application into the nose 2 (two) times daily.    . predniSONE (DELTASONE) 10 MG tablet   0  . silver sulfADIAZINE (SILVADENE) 1 % cream Apply 1 application topically 2 (two) times daily.    . solifenacin (VESICARE) 5 MG tablet Take 5 mg by mouth daily.     No current facility-administered medications for this visit.     REVIEW OF SYSTEMS:   [X]  denotes positive finding, [ ]  denotes negative finding Cardiac  Comments:  Chest pain or chest pressure:    Shortness of breath upon exertion:    Short of breath when lying flat:    Irregular heart rhythm: x       Vascular    Pain in calf, thigh, or hip brought on by ambulation: x   Pain in feet at night that wakes you up from your sleep:  x   Blood clot in your veins:    Leg swelling:  x       Pulmonary    Oxygen at home:    Productive cough:  x   Wheezing:         Neurologic    Sudden weakness in arms or legs:  x   Sudden numbness in arms or legs:     Sudden onset of difficulty speaking or slurred speech:    Temporary loss of vision in one eye:     Problems with dizziness:  x       Gastrointestinal    Blood in stool:      Vomited blood:         Genitourinary    Burning when urinating:     Blood in urine:        Psychiatric    Major depression:         Hematologic    Bleeding problems:    Problems  with blood clotting too easily:        Skin    Rashes or ulcers: x       Constitutional    Fever or chills:      PHYSICAL EXAM:   Vitals:   07/08/18 1344  BP: (!) 141/85  Pulse: 64  Resp: 14  Temp: (!) 97.1 F (36.2 C)  TempSrc: Oral  SpO2: 100%  Weight: 153 lb (69.4 kg)  Height: 5\' 2"  (1.575 m)    GENERAL: The patient is a well-nourished female, in no acute distress. The vital signs are documented above. CARDIAC: There is a regular rate and rhythm.  VASCULAR: Palpable dorsalis pedis pulse bilaterally.  Painful pitting edema bilaterally with hyperpigmentation PULMONARY: Nonlabored respirations ABDOMEN: Soft and non-tender with normal pitched bowel sounds.  MUSCULOSKELETAL: There are no major deformities or cyanosis. NEUROLOGIC: No focal weakness or paresthesias are detected. SKIN: Healed small ulcers on the left  PSYCHIATRIC: The patient has a normal affect.  STUDIES:   I have ordered and reviewed her vascular lab studies with the following findings:  +-------+-----------+-----------+------------+------------+ ABI/TBIToday's ABIToday's TBIPrevious ABIPrevious TBI +-------+-----------+-----------+------------+------------+ Right [place]     0.55                 +-------+-----------+-----------+------------+------------+ Left  NA     0.87                 +-------+-----------+-----------+------------+------------+ Left toe pressure= 127.  Right toe pressure= 80  ASSESSMENT and PLAN   Chronic venous insufficiency: The patient has significant bilateral edema along with hyperpigmentation and nearly healed small ulcers on the left ankle.  Because she has palpable pedal pulses and a very normal ABI study, I feel all of her symptoms are related to her venous insufficiency and leg swelling.  Unfortunately she cannot tolerate compression stockings because of pain.  I have encouraged her to keep her legs elevated as best possible.  If she develops a new wound, I will have her evaluated at the wound center.  She is going to try to wear diabetic  socks to at least get some form of compression.   Durene Cal, MD [facility] [phone] Pager [phone]

## 2018-07-29 ENCOUNTER — Telehealth: Payer: Self-pay | Admitting: *Deleted

## 2018-07-29 ENCOUNTER — Emergency Department (HOSPITAL_COMMUNITY)
Admission: EM | Admit: 2018-07-29 | Discharge: 2018-07-29 | Disposition: A | Discharge status: Home / Self Care | Payer: Medicare Other | Attending: Emergency Medicine | Admitting: Emergency Medicine

## 2018-07-29 ENCOUNTER — Encounter (HOSPITAL_COMMUNITY): Payer: Self-pay

## 2018-07-29 DIAGNOSIS — Z79899 Other long term (current) drug therapy: Secondary | ICD-10-CM | POA: Diagnosis not present

## 2018-07-29 DIAGNOSIS — Z7982 Long term (current) use of aspirin: Secondary | ICD-10-CM | POA: Insufficient documentation

## 2018-07-29 DIAGNOSIS — Z87891 Personal history of nicotine dependence: Secondary | ICD-10-CM | POA: Diagnosis not present

## 2018-07-29 DIAGNOSIS — E119 Type 2 diabetes mellitus without complications: Secondary | ICD-10-CM | POA: Diagnosis not present

## 2018-07-29 DIAGNOSIS — G20 Parkinson's disease: Secondary | ICD-10-CM | POA: Insufficient documentation

## 2018-07-29 DIAGNOSIS — E039 Hypothyroidism, unspecified: Secondary | ICD-10-CM | POA: Diagnosis not present

## 2018-07-29 DIAGNOSIS — L299 Pruritus, unspecified: Secondary | ICD-10-CM | POA: Diagnosis present

## 2018-07-29 DIAGNOSIS — I10 Essential (primary) hypertension: Secondary | ICD-10-CM | POA: Insufficient documentation

## 2018-07-29 DIAGNOSIS — R21 Rash and other nonspecific skin eruption: Secondary | ICD-10-CM | POA: Insufficient documentation

## 2018-07-29 MED ORDER — HYDROXYZINE HCL 25 MG PO TABS: 25.0000 mg | ORAL_TABLET | Freq: Three times a day (TID) | ORAL | 0 refills | Status: AC | PRN

## 2018-07-29 NOTE — ED Notes (Signed)
Patient verbalizes understanding of discharge instructions. Opportunity for questioning and answers were provided. Armband removed by staff, pt discharged from ED.  

## 2018-07-29 NOTE — ED Triage Notes (Signed)
Pt reports upper back pain and itching. Pt states she received her shingles vaccine this month. No rash noted to back

## 2018-07-29 NOTE — ED Provider Notes (Signed)
[facility]   CSN: [id number] Arrival date & time: 07/29/18  1127     History   Chief Complaint Chief Complaint  Patient presents with  . Back Pain    HPI Vickie RobJacqueline Marksman-Jones is a 72 y.o. female.  The history is provided by the patient.  Illness  This is a chronic problem. The current episode started more than 1 week ago. The problem occurs daily. The problem has not changed since onset.Pertinent negatives include no chest pain, no abdominal pain and no shortness of breath. Associated symptoms comments: Itching of upper back. Nothing aggravates the symptoms. Relieved by: benadryl. Treatments tried: benadryl. The treatment provided mild relief.    Past Medical History:  Diagnosis Date  . Acid reflux   . Arthritis   . Carpal tunnel syndrome   . Celiac disease   . Diabetes mellitus without complication (HCC)   . Early-onset Parkinson's disease (HCC)   . Glaucoma   . Hypertension   . Polyp of colon   . Thyroid disease    Hypothyroid  . Urine incontinence   . Evelene CroonWolff Parkinson White pattern seen on electrocardiogram     Patient Active Problem List   Diagnosis Date Noted  . Mass of ear auricle, right 05/23/2018  . Weakness 05/29/2017  . Blurry vision 05/29/2017  . Other dysphagia 05/29/2017  . Chronic venous insufficiency 03/28/2016  . Pain in both lower extremities 03/28/2016  . Stasis ulcer (HCC) 03/28/2016    Past Surgical History:  Procedure Laterality Date  . ABDOMINAL HYSTERECTOMY    . BREAST SURGERY     lumpectomy  . VASCULAR SURGERY       OB History   No obstetric history on file.      Home Medications    Prior to Admission medications   Medication Sig Start Date End Date Taking? Authorizing Provider  aspirin EC 81 MG tablet Take 81 mg by mouth daily.    [provider]  b complex vitamins capsule Take by mouth.    [provider]  B Complex-C (B-COMPLEX WITH VITAMIN  C) tablet Take 1 tablet by mouth daily.    [provider]  bifidobacterium infantis (ALIGN) capsule Take by mouth.    [provider]  Calcium-Magnesium-Vitamin D (CALCIUM 500 PO) Take by mouth.    [provider]  Cholecalciferol (VITAMIN D-3 PO) Take by mouth.    [provider]  Coenzyme Q10 (CO Q 10 PO) Take 1 tablet by mouth daily.    [provider]  diphenhydrAMINE (BENADRYL) 25 mg capsule Take 25 mg by mouth every 6 (six) hours as needed for itching.    [provider]  EPINEPHrine 0.3 mg/0.3 mL IJ SOAJ injection Inject 0.3 mLs (0.3 mg total) into the muscle once. Patient not taking: Reported on 07/08/2018 02/14/16   Jerre SimonFocht, Jessica L, PA  hydrOXYzine (ATARAX/VISTARIL) 25 MG tablet Take 1 tablet (25 mg total) by mouth every 8 (eight) hours as needed for up to 30 doses. 07/29/18   Alisah Grandberry, DO  latanoprost (XALATAN) 0.005 % ophthalmic solution Place 1 drop into both eyes at bedtime.    [provider]  levothyroxine (SYNTHROID, LEVOTHROID) 50 MCG tablet Take 50 mcg by mouth daily before breakfast.    [provider]  mupirocin ointment (BACTROBAN) 2 % Place 1 application into the nose 2 (two) times daily.    [provider]  Omega-3 Fatty Acids (OMEGA 3 PO) Take by mouth.  [provider]  predniSONE (DELTASONE) 10 MG tablet  05/17/18   [provider]  silver sulfADIAZINE (SILVADENE) 1 % cream Apply 1 application topically 2 (two) times daily.    [provider]  Skin Protectants, Misc. (EUCERIN) cream Apply 1 application topically 2 (two) times daily.    [provider]  solifenacin (VESICARE) 5 MG tablet Take 5 mg by mouth daily.    [provider]  TURMERIC PO Take 5 mLs by mouth every other day.    [provider]    Family History Family History  Problem Relation Age of Onset  . Rheumatic fever Mother   . Hypertension Mother   .  Diverticulitis Mother   . Arthritis Mother   . Throat cancer Father     Social History Social History   Tobacco Use  . Smoking status: Former Games developer  . Smokeless tobacco: Never Used  . Tobacco comment: Reports smoking for less than one year.  Substance Use Topics  . Alcohol use: No  . Drug use: No     Allergies   Gluten meal; Codeine; and Wheat bran   Review of Systems Review of Systems  HENT: Negative for ear discharge, ear pain, sinus pressure, sinus pain, sore throat and tinnitus.   Respiratory: Negative for shortness of breath.   Cardiovascular: Negative for chest pain.  Gastrointestinal: Negative for abdominal pain.  Musculoskeletal: Positive for arthralgias. Negative for back pain, gait problem, joint swelling, myalgias, neck pain and neck stiffness.  Skin: Negative for color change, pallor, rash and wound.  Hematological: Negative for adenopathy. Does not bruise/bleed easily.     Physical Exam Updated Vital Signs  ED Triage Vitals [07/29/18 1143]  Enc Vitals Group     BP (!) 141/80     Pulse Rate 65     Resp 14     Temp 97.9 F (36.6 C)     Temp Source Oral     SpO2 100 %     Weight      Height      Head Circumference      Peak Flow      Pain Score 0     Pain Loc      Pain Edu?      Excl. in GC?     Physical Exam HENT:     Mouth/Throat:     Mouth: Mucous membranes are moist.     Pharynx: No oropharyngeal exudate or posterior oropharyngeal erythema.  Cardiovascular:     Pulses: Normal pulses.     Heart sounds: Normal heart sounds.  Pulmonary:     Effort: Pulmonary effort is normal. No respiratory distress.     Breath sounds: Normal breath sounds.  Skin:    General: Skin is warm.     Capillary Refill: Capillary refill takes less than 2 seconds.     Coloration: Skin is not jaundiced or pale.     Findings: Rash (two hives on upper back) present.      ED Treatments / Results  Labs (all labs ordered are listed, but only abnormal results  are displayed) Labs Reviewed - No data to display  EKG None  Radiology No results found.  Procedures Procedures (including critical care time)  Medications Ordered in ED Medications - No data to display   Initial Impression / Assessment and Plan / ED Course  I have reviewed the triage vital signs and the nursing notes.  Pertinent labs & imaging results that were available  during my care of the patient were reviewed by me and considered in my medical decision making (see chart for details).     Vickie RobJacqueline Marksman-Jones is a 72 year old female history of hypertension who presents to the ED with itching.  Patient with normal vitals.  No fever.  Patient with itching of her upper back for the last several weeks.  She states that this is happened before.  She used Benadryl twice last week with relief.  Has not used any since.  Has what looks like some mild hives to the upper back.  Possibly mild allergic reaction.  Patient does have a bra on that may also be causing irritation.  There is no signs suggesting shingles or other concerning type rash.  We will give her a prescription for Vistaril for symptomatic relief and recommend follow-up with primary care doctor.  Patient discharged from ED in good condition.  This chart was dictated using voice recognition software.  Despite best efforts to proofread,  errors can occur which can change the documentation meaning.   Final Clinical Impressions(s) / ED Diagnoses   Final diagnoses:  Itching    ED Discharge Orders         Ordered    hydrOXYzine (ATARAX/VISTARIL) 25 MG tablet  Every 8 hours PRN     07/29/18 1157           Virgina NorfolkCuratolo, Shirrell Solinger, DO 07/29/18 1158

## 2018-07-29 NOTE — Telephone Encounter (Signed)
Patient called in to get her results from her recent study she has 12/9. Dr. Myra GianottiBrabham saw her and told her the arterial study was wnl but commented that she has venous insuf and needed to wear compression stockings and elevate. Patient reports she can't get the stockings on. I suggested getting a size larger which would make getting the stockings on easier. It might not be 20-30 strength but would be better than nothing. She has "diabetic socks" but says those don't help her swelling. She says both legs are the same size, meaning one is not larger than the other. We discussed symptoms of DVT. She expressed understanding. We will follow prn.

## 2020-09-22 DIAGNOSIS — M170 Bilateral primary osteoarthritis of knee: Secondary | ICD-10-CM | POA: Diagnosis not present

## 2020-09-22 DIAGNOSIS — G4700 Insomnia, unspecified: Secondary | ICD-10-CM | POA: Diagnosis not present

## 2020-09-22 DIAGNOSIS — E1140 Type 2 diabetes mellitus with diabetic neuropathy, unspecified: Secondary | ICD-10-CM | POA: Diagnosis not present

## 2020-09-22 DIAGNOSIS — K219 Gastro-esophageal reflux disease without esophagitis: Secondary | ICD-10-CM | POA: Diagnosis not present

## 2020-09-22 DIAGNOSIS — E039 Hypothyroidism, unspecified: Secondary | ICD-10-CM | POA: Diagnosis not present

## 2020-09-22 DIAGNOSIS — H35033 Hypertensive retinopathy, bilateral: Secondary | ICD-10-CM | POA: Diagnosis not present

## 2020-09-22 DIAGNOSIS — H4089 Other specified glaucoma: Secondary | ICD-10-CM | POA: Diagnosis not present

## 2020-09-22 DIAGNOSIS — E782 Mixed hyperlipidemia: Secondary | ICD-10-CM | POA: Diagnosis not present

## 2020-09-22 DIAGNOSIS — I10 Essential (primary) hypertension: Secondary | ICD-10-CM | POA: Diagnosis not present

## 2021-02-22 DIAGNOSIS — E782 Mixed hyperlipidemia: Secondary | ICD-10-CM | POA: Diagnosis not present

## 2021-02-22 DIAGNOSIS — M170 Bilateral primary osteoarthritis of knee: Secondary | ICD-10-CM | POA: Diagnosis not present

## 2021-02-22 DIAGNOSIS — I10 Essential (primary) hypertension: Secondary | ICD-10-CM | POA: Diagnosis not present

## 2021-02-22 DIAGNOSIS — K219 Gastro-esophageal reflux disease without esophagitis: Secondary | ICD-10-CM | POA: Diagnosis not present

## 2021-02-22 DIAGNOSIS — E1140 Type 2 diabetes mellitus with diabetic neuropathy, unspecified: Secondary | ICD-10-CM | POA: Diagnosis not present

## 2021-02-22 DIAGNOSIS — G4700 Insomnia, unspecified: Secondary | ICD-10-CM | POA: Diagnosis not present

## 2021-02-22 DIAGNOSIS — H4089 Other specified glaucoma: Secondary | ICD-10-CM | POA: Diagnosis not present

## 2021-02-22 DIAGNOSIS — H35033 Hypertensive retinopathy, bilateral: Secondary | ICD-10-CM | POA: Diagnosis not present

## 2021-02-22 DIAGNOSIS — E039 Hypothyroidism, unspecified: Secondary | ICD-10-CM | POA: Diagnosis not present

## 2021-04-14 DIAGNOSIS — G4700 Insomnia, unspecified: Secondary | ICD-10-CM | POA: Diagnosis not present

## 2021-04-14 DIAGNOSIS — H4089 Other specified glaucoma: Secondary | ICD-10-CM | POA: Diagnosis not present

## 2021-04-14 DIAGNOSIS — E039 Hypothyroidism, unspecified: Secondary | ICD-10-CM | POA: Diagnosis not present

## 2021-04-14 DIAGNOSIS — E782 Mixed hyperlipidemia: Secondary | ICD-10-CM | POA: Diagnosis not present

## 2021-04-14 DIAGNOSIS — K219 Gastro-esophageal reflux disease without esophagitis: Secondary | ICD-10-CM | POA: Diagnosis not present

## 2021-04-14 DIAGNOSIS — E1140 Type 2 diabetes mellitus with diabetic neuropathy, unspecified: Secondary | ICD-10-CM | POA: Diagnosis not present

## 2021-04-14 DIAGNOSIS — I10 Essential (primary) hypertension: Secondary | ICD-10-CM | POA: Diagnosis not present

## 2021-04-14 DIAGNOSIS — H35033 Hypertensive retinopathy, bilateral: Secondary | ICD-10-CM | POA: Diagnosis not present
